# Patient Record
Sex: Female | Born: 1975 | Race: Black or African American | Hispanic: No | Marital: Married | Smoking: Never smoker
Health system: Southern US, Community
[De-identification: ages and names within clinical notes are randomized; demographics above are authoritative.]

## PROBLEM LIST (undated history)

## (undated) DIAGNOSIS — K219 Gastro-esophageal reflux disease without esophagitis: Secondary | ICD-10-CM

## (undated) HISTORY — PX: MYOMECTOMY: SHX85

## (undated) HISTORY — PX: CERVICAL CERCLAGE: SHX1329

## (undated) HISTORY — PX: DILATION AND CURETTAGE OF UTERUS: SHX78

---

## 2016-03-28 ENCOUNTER — Encounter: Payer: Self-pay | Admitting: Obstetrics and Gynecology

## 2016-04-03 ENCOUNTER — Encounter: Payer: Self-pay | Admitting: Obstetrics and Gynecology

## 2016-04-03 ENCOUNTER — Ambulatory Visit (INDEPENDENT_AMBULATORY_CARE_PROVIDER_SITE_OTHER): Payer: 59 | Admitting: Obstetrics and Gynecology

## 2016-04-03 VITALS — BP 101/66 | HR 83 | Ht 59.0 in | Wt 176.9 lb

## 2016-04-03 DIAGNOSIS — R102 Pelvic and perineal pain: Secondary | ICD-10-CM

## 2016-04-03 DIAGNOSIS — D259 Leiomyoma of uterus, unspecified: Secondary | ICD-10-CM

## 2016-04-03 DIAGNOSIS — Z98891 History of uterine scar from previous surgery: Secondary | ICD-10-CM | POA: Diagnosis not present

## 2016-04-03 DIAGNOSIS — O3431 Maternal care for cervical incompetence, first trimester: Secondary | ICD-10-CM

## 2016-04-03 DIAGNOSIS — E669 Obesity, unspecified: Secondary | ICD-10-CM

## 2016-04-03 DIAGNOSIS — O09529 Supervision of elderly multigravida, unspecified trimester: Secondary | ICD-10-CM

## 2016-04-03 DIAGNOSIS — Z9889 Other specified postprocedural states: Secondary | ICD-10-CM

## 2016-04-03 NOTE — Progress Notes (Signed)
NEW PATIENT GYN ENCOUNTER NOTE  Subjective:       Lindsey Mills is a 41 y.o. G71P1021 female here for gynecologic evaluation of the following issues:  1. Fertility 2. Pelvic pain  Mylove recently moved here from [place] and would like to establish care, as she's been trying to conceive since Oct 2017. She has regular cycles every 32-34 days with 6 days of bleeding and some mild cramping. She denies spotting between periods. She's had one spontaneous abortion which required dilation and curettage of the uterus in 2013. She has a history of leiomyomas which were removed in 2014 before conceiving in 2015. She delivered her term pregnancy in 2015 via cesarean section. The patient did have a cerclage placed during her successful pregnancy due to suspected cervical incompetence  Future pregnancies likely will require repeat cesarean section due to history of myomectomy and McDonald cerclage due to history of cervical incompetence. The patient has longer intervals between cycles and is interested in possible workup for infertility due to age.  Her LMP started today 2/7 and was reportedly 1 week late and came with more cramping and heavier bleeding than usual. She reports right-sided pelvic pain which was 3/4 tender on physical exam.   Gynecologic History Patient's last menstrual period was 04/03/2016 (exact date). Contraception: none Last Pap: "last year" Results were: Normal   Obstetric History OB History  Gravida Para Term Preterm AB Living  3 1 1   2 1   SAB TAB Ectopic Multiple Live Births  1       1    # Outcome Date GA Lbr Len/2nd Weight Sex Delivery Anes PTL Lv  3 Term 2015   6 lb 1.8 oz (2.771 kg) F CS-LTranv   LIV  2 SAB 2013          1 AB 2000              No past medical history on file.  Past Surgical History:  Procedure Laterality Date  . CESAREAN SECTION    . DILATION AND CURETTAGE OF UTERUS      No current outpatient prescriptions on file prior to visit.   No  current facility-administered medications on file prior to visit.     No Known Allergies  Social History   Social History  . Marital status: Married    Spouse name: N/A  . Number of children: N/A  . Years of education: N/A   Occupational History  . Not on file.   Social History Main Topics  . Smoking status: Never Smoker  . Smokeless tobacco: Never Used  . Alcohol use No  . Drug use: No  . Sexual activity: Yes    Birth control/ protection: None   Other Topics Concern  . Not on file   Social History Narrative  . No narrative on file    Family History  Problem Relation Age of Onset  . Colon cancer Father   . Breast cancer Neg Hx   . Ovarian cancer Neg Hx   . Diabetes Neg Hx   . Heart disease Neg Hx     The following portions of the patient's history were reviewed and updated as appropriate: allergies, current medications, past family history, past medical history, past social history, past surgical history and problem list.  Review of Systems Review of Systems - General ROS: negative for - chills, fatigue, fever, hot flashes, malaise or night sweats Hematological and Lymphatic ROS: negative for - bleeding problems or swollen lymph  nodes Gastrointestinal ROS: negative for - abdominal pain, blood in stools, change in bowel habits and nausea/vomiting Musculoskeletal ROS: negative for - joint pain, muscle pain or muscular weakness Genito-Urinary ROS: negative for - right -sided pelvic pain with onset of mensturation  Objective:   BP 101/66   Pulse 83   Ht 4\' 11"  (1.499 m)   Wt 176 lb 14.4 oz (80.2 kg)   LMP 04/03/2016 (Exact Date)   BMI 35.73 kg/m  CONSTITUTIONAL: Well-developed, well-nourished female in no acute distress.  HENT:  Normocephalic, atraumatic.  NECK: Normal range of motion, supple, no masses.  Normal thyroid.  SKIN: Skin is warm and dry. No rash noted. Not diaphoretic. No erythema. No pallor. [place]: Alert and oriented to person, place, and  time. PSYCHIATRIC: Normal mood and affect. Normal behavior. Normal judgment and thought content. CARDIOVASCULAR: Regular rate and rhythm RESPIRATORY: Lungs clear to auscultation bilaterally  BREASTS: Not Examined ABDOMEN: Soft, non distended; Non tender.  No Organomegaly. PELVIC:  External Genitalia: Normal  BUS: Normal  Vagina: Normal  Cervix: 2/4 Cervical motion tenderness  Uterus: 12 week size, midline, mobile, 2/4 tender    Adnexa: Normal  RV: Normal   Bladder: Nontender MUSCULOSKELETAL: Normal range of motion. No tenderness.  No cyanosis, clubbing, or edema.     Assessment:   1. Pelvic pain, Unclear etiology; possibly due to uterine fibroids, adhesions, or adenomyosis 2. Long intervals; Fertility assessment requested, starting with BBT tracking sheet  3. History of myomectomy 4. History of cervical incompetence 5. Only successful pregnancy included [employer] cerclage placement and primary cesarean section delivery; patient did have history of significant nausea with vomiting requiring IV hydration in first trimester  Plan:   1. Ultrasound is scheduled to assess pelvic pain and possible uterine fibroids 2. Return in 2 weeks for annual examination and further management planning regarding fertility workup 3. Return on March 6 for serum progesterone blood test. 4. Continue taking prenatal vitamins  5. Menstrual calendar monitoring; timed coitus; serum progesterone test to assess ovulation will be scheduled approximately 8 days after suspected ovulation (34 day cycle; suspected ovulation-day 20; progesterone to be obtained on cycle day 28)  A total of 45 minutes were spent face-to-face with the patient during the encounter with greater than 50% dealing with counseling and coordination of care.  Janeece Riggers, PA-S Brayton Mars, MD   I have seen, interviewed, and examined the patient in conjunction with the [facility].A. student and affirm the diagnosis and  management plan. Tyresa Prindiville A. Marrisa Kimber, MD, FACOG   Note: This dictation was prepared with Dragon dictation along with smaller phrase technology. Any transcriptional errors that result from this process are unintentional.

## 2016-04-03 NOTE — Patient Instructions (Signed)
1. Ultrasound is scheduled to assess pelvic pain and possible uterine fibroids 2. Return in 2 weeks for annual examination and further management planning regarding fertility workup 3. Return on March 6 for serum progesterone blood test. 4. Continue taking prenatal vitamins

## 2016-04-04 DIAGNOSIS — Z98891 History of uterine scar from previous surgery: Secondary | ICD-10-CM | POA: Insufficient documentation

## 2016-04-04 DIAGNOSIS — O09299 Supervision of pregnancy with other poor reproductive or obstetric history, unspecified trimester: Secondary | ICD-10-CM | POA: Insufficient documentation

## 2016-04-04 DIAGNOSIS — R102 Pelvic and perineal pain unspecified side: Secondary | ICD-10-CM | POA: Insufficient documentation

## 2016-04-04 DIAGNOSIS — E669 Obesity, unspecified: Secondary | ICD-10-CM | POA: Insufficient documentation

## 2016-04-04 DIAGNOSIS — Z9889 Other specified postprocedural states: Secondary | ICD-10-CM | POA: Insufficient documentation

## 2016-04-04 DIAGNOSIS — O09529 Supervision of elderly multigravida, unspecified trimester: Secondary | ICD-10-CM | POA: Insufficient documentation

## 2016-04-04 DIAGNOSIS — O3431 Maternal care for cervical incompetence, first trimester: Secondary | ICD-10-CM

## 2016-04-04 DIAGNOSIS — D259 Leiomyoma of uterus, unspecified: Secondary | ICD-10-CM | POA: Insufficient documentation

## 2016-04-05 ENCOUNTER — Ambulatory Visit (INDEPENDENT_AMBULATORY_CARE_PROVIDER_SITE_OTHER): Payer: 59

## 2016-04-05 DIAGNOSIS — R102 Pelvic and perineal pain: Secondary | ICD-10-CM

## 2016-04-22 NOTE — Progress Notes (Signed)
ANNUAL PREVENTATIVE CARE GYN  ENCOUNTER NOTE  Subjective:       Lindsey Mills is a 41 y.o. G82P1021 female here for a routine annual gynecologic exam.  Current complaints: 1.   Right sided pelvic pain  2.- u/s results   Gynecologic History Patient's last menstrual period was 04/03/2016 (exact date). Contraception: none Last Pap: 2017. Results were: normal Last mammogram: never  Menarche-age 35 Intervals-32-34 days Duration of flow-7 days Dysmenorrhea-7/10; no medication needed Dyspareunia-negative   Obstetric History History of [employer] cerclage and successful pregnancy History of myomectomy requiring cesarean section History of cesarean section OB History  Gravida Para Term Preterm AB Living  3 1 1   2 1   SAB TAB Ectopic Multiple Live Births  1       1    # Outcome Date GA Lbr Len/2nd Weight Sex Delivery Anes PTL Lv  3 Term 2015   6 lb 1.8 oz (2.771 kg) F CS-LTranv   LIV  2 SAB 2013          1 AB 2000              No past medical history on file.  Past Surgical History:  Procedure Laterality Date  . CESAREAN SECTION    . DILATION AND CURETTAGE OF UTERUS      No current outpatient prescriptions on file prior to visit.   No current facility-administered medications on file prior to visit.     No Known Allergies  Social History   Social History  . Marital status: Married    Spouse name: N/A  . Number of children: N/A  . Years of education: N/A   Occupational History  . Not on file.   Social History Main Topics  . Smoking status: Never Smoker  . Smokeless tobacco: Never Used  . Alcohol use No  . Drug use: No  . Sexual activity: Yes    Birth control/ protection: None   Other Topics Concern  . Not on file   Social History Narrative  . No narrative on file    Family History  Problem Relation Age of Onset  . Colon cancer Father   . Breast cancer Neg Hx   . Ovarian cancer Neg Hx   . Diabetes Neg Hx   . Heart disease Neg Hx     The  following portions of the patient's history were reviewed and updated as appropriate: allergies, current medications, past family history, past medical history, past social history, past surgical history and problem list.  Review of Systems Review of Systems  Constitutional: Negative for chills, diaphoresis, fever and weight loss.  HENT: Negative.   Eyes: Negative.   Respiratory: Negative.   Cardiovascular: Negative.   Gastrointestinal: Positive for constipation. Negative for abdominal pain, diarrhea, nausea and vomiting.       Prenatal vitamins cause constipation  Genitourinary: Negative for dysuria, frequency and urgency.       No dyspareunia  Musculoskeletal: Negative.   Skin: Negative.   Neurological: Negative.   Endo/Heme/Allergies: Negative.   Psychiatric/Behavioral: Negative.       Objective:   LMP 04/03/2016 (Exact Date)  BP 100/64   Pulse 88   Ht 4\' 11"  (1.499 m)   Wt 173 lb 14.4 oz (78.9 kg)   LMP 04/03/2016 (Exact Date)   BMI 35.12 kg/m   CONSTITUTIONAL: Well-developed, well-nourished female in no acute distress.  PSYCHIATRIC: Normal mood and affect. Normal behavior. Normal judgment and thought content. [name]: Alert and oriented to  person, place, and time. Normal muscle tone coordination. No cranial nerve deficit noted. HENT:  Normocephalic, atraumatic, External right and left ear normal.  EYES: Conjunctivae and EOM are normal. . No scleral icterus.  NECK: Normal range of motion, supple, no masses.  Normal thyroid.  SKIN: Skin is warm and dry. No rash noted. Not diaphoretic. No erythema. No pallor. CARDIOVASCULAR: Normal heart rate noted, regular rhythm, no murmur. RESPIRATORY: Clear to auscultation bilaterally. Effort and breath sounds normal, no problems with respiration noted. BREASTS: Symmetric in size. No masses, skin changes, nipple drainage, or lymphadenopathy. ABDOMEN: Soft, normal bowel sounds, no distention noted.  No tenderness, rebound or guarding.   BLADDER: Normal PELVIC:  External Genitalia: Normal  BUS: Normal  Vagina: Normal  Cervix: Normal; anteriorly located: No cervical motion tenderness; no lesions  Uterus: Normal; retroverted, top normal size, mobile, nontender;   Adnexa: Normal;nonpalpable and nontender  RV: External Exam NormaI, No Rectal Masses and Normal Sphincter tone  MUSCULOSKELETAL: Normal range of motion. No tenderness.  No cyanosis, clubbing, or edema.  2+ distal pulses. LYMPHATIC: No Axillary, Supraclavicular, or Inguinal Adenopathy.    Assessment:   Annual gynecologic examination 41 y.o. Contraception: none [id number] Problem List Items Addressed This Visit    History of cesarean section   History of myomectomy   Uterine leiomyoma   Obesity (BMI 35.0-39.9 without comorbidity)    Other Visit Diagnoses    Well woman exam with routine gynecological exam    -  Primary     Oligomenorrhea with cycles every 32-34 days Desiring conception History of myomectomy History of [employer] cerclage History of primary cesarean section delivery Desiring assistance with conception due to age and oligomenorrhea  Plan:  Pap: Pap Co Test Mammogram: Ordered Stool Guaiac Testing:  Not Indicated Labs: lipid vit d tsh a1c fbs Routine preventative health maintenance measures emphasized: Exercise/Diet/Weight control, Tobacco Warnings and Alcohol/Substance use risks Date 22 serum progesterone Continue prenatal vitamins Continue menstrual calendar monitoring Continue timed intercourse Return in 4 months for follow-up on oligomenorrhea and fertility Return to [place], CMA  Brayton Mars, MD  Note: This dictation was prepared with [employer] dictation along with smaller Company secretary. Any transcriptional errors that result from this process are unintentional.

## 2016-04-25 ENCOUNTER — Ambulatory Visit (INDEPENDENT_AMBULATORY_CARE_PROVIDER_SITE_OTHER): Payer: 59 | Admitting: Obstetrics and Gynecology

## 2016-04-25 ENCOUNTER — Encounter: Payer: Self-pay | Admitting: Obstetrics and Gynecology

## 2016-04-25 VITALS — BP 100/64 | HR 88 | Ht 59.0 in | Wt 173.9 lb

## 2016-04-25 DIAGNOSIS — Z9889 Other specified postprocedural states: Secondary | ICD-10-CM | POA: Diagnosis not present

## 2016-04-25 DIAGNOSIS — Z1231 Encounter for screening mammogram for malignant neoplasm of breast: Secondary | ICD-10-CM | POA: Diagnosis not present

## 2016-04-25 DIAGNOSIS — Z98891 History of uterine scar from previous surgery: Secondary | ICD-10-CM | POA: Diagnosis not present

## 2016-04-25 DIAGNOSIS — N946 Dysmenorrhea, unspecified: Secondary | ICD-10-CM | POA: Diagnosis not present

## 2016-04-25 DIAGNOSIS — Z01419 Encounter for gynecological examination (general) (routine) without abnormal findings: Secondary | ICD-10-CM | POA: Diagnosis not present

## 2016-04-25 DIAGNOSIS — E669 Obesity, unspecified: Secondary | ICD-10-CM

## 2016-04-25 DIAGNOSIS — N920 Excessive and frequent menstruation with regular cycle: Secondary | ICD-10-CM | POA: Insufficient documentation

## 2016-04-25 DIAGNOSIS — D259 Leiomyoma of uterus, unspecified: Secondary | ICD-10-CM | POA: Diagnosis not present

## 2016-04-25 DIAGNOSIS — Z1239 Encounter for other screening for malignant neoplasm of breast: Secondary | ICD-10-CM

## 2016-04-25 NOTE — Patient Instructions (Signed)
1. Pap smear is performed. 2. Mammogram is ordered 3. Continue with healthy eating and exercise 4. Continue with prenatal vitamins 5. Screening labs are ordered and will be obtained on 04/30/2016 along with day 22 serum progesterone 6. Return in 1 year for annual exam7. Return in 4 months for follow-up on oligomenorrhea and fertility; we will consider Clomid therapy if cycles are consistently long greater than 32 days   Health Maintenance, Female Adopting a healthy lifestyle and getting preventive care can go a long way to promote health and wellness. Talk with your health care provider about what schedule of regular examinations is right for you. This is a good chance for you to check in with your provider about disease prevention and staying healthy. In between checkups, there are plenty of things you can do on your own. Experts have done a lot of research about which lifestyle changes and preventive measures are most likely to keep you healthy. Ask your health care provider for more information. Weight and diet Eat a healthy diet  Be sure to include plenty of vegetables, fruits, low-fat dairy products, and lean protein.  Do not eat a lot of foods high in solid fats, added sugars, or salt.  Get regular exercise. This is one of the most important things you can do for your health.  Most adults should exercise for at least 150 minutes each week. The exercise should increase your heart rate and make you sweat (moderate-intensity exercise).  Most adults should also do strengthening exercises at least twice a week. This is in addition to the moderate-intensity exercise. Maintain a healthy weight  Body mass index (BMI) is a measurement that can be used to identify possible weight problems. It estimates body fat based on height and weight. Your health care provider can help determine your BMI and help you achieve or maintain a healthy weight.  For females 58 years of age and older:  A BMI  below 18.5 is considered underweight.  A BMI of 18.5 to 24.9 is normal.  A BMI of 25 to 29.9 is considered overweight.  A BMI of 30 and above is considered obese. Watch levels of cholesterol and blood lipids  You should start having your blood tested for lipids and cholesterol at 40 years of age, then have this test every 5 years.  You may need to have your cholesterol levels checked more often if:  Your lipid or cholesterol levels are high.  You are older than 41 years of age.  You are at high risk for heart disease. Cancer screening Lung Cancer  Lung cancer screening is recommended for adults 79-61 years old who are at high risk for lung cancer because of a history of smoking.  A yearly low-dose CT scan of the lungs is recommended for people who:  Currently smoke.  Have quit within the past 15 years.  Have at least a 30-pack-year history of smoking. A pack year is smoking an average of one pack of cigarettes a day for 1 year.  Yearly screening should continue until it has been 15 years since you quit.  Yearly screening should stop if you develop a health problem that would prevent you from having lung cancer treatment. Breast Cancer  Practice breast self-awareness. This means understanding how your breasts normally appear and feel.  It also means doing regular breast self-exams. Let your health care provider know about any changes, no matter how small.  If you are in your 20s or 30s, you should  have a clinical breast exam (CBE) by a health care provider every 1-3 years as part of a regular health exam.  If you are 31 or older, have a CBE every year. Also consider having a breast X-ray (mammogram) every year.  If you have a family history of breast cancer, talk to your health care provider about genetic screening.  If you are at high risk for breast cancer, talk to your health care provider about having an MRI and a mammogram every year.  Breast cancer gene (BRCA)  assessment is recommended for women who have family members with BRCA-related cancers. BRCA-related cancers include:  Breast.  Ovarian.  Tubal.  Peritoneal cancers.  Results of the assessment will determine the need for genetic counseling and BRCA1 and BRCA2 testing. Cervical Cancer  Your health care provider may recommend that you be screened regularly for cancer of the pelvic organs (ovaries, uterus, and vagina). This screening involves a pelvic examination, including checking for microscopic changes to the surface of your cervix (Pap test). You may be encouraged to have this screening done every 3 years, beginning at age 20.  For women ages 58-65, health care providers may recommend pelvic exams and Pap testing every 3 years, or they may recommend the Pap and pelvic exam, combined with testing for human papilloma virus (HPV), every 5 years. Some types of HPV increase your risk of cervical cancer. Testing for HPV may also be done on women of any age with unclear Pap test results.  Other health care providers may not recommend any screening for nonpregnant women who are considered low risk for pelvic cancer and who do not have symptoms. Ask your health care provider if a screening pelvic exam is right for you.  If you have had past treatment for cervical cancer or a condition that could lead to cancer, you need Pap tests and screening for cancer for at least 20 years after your treatment. If Pap tests have been discontinued, your risk factors (such as having a new sexual partner) need to be reassessed to determine if screening should resume. Some women have medical problems that increase the chance of getting cervical cancer. In these cases, your health care provider may recommend more frequent screening and Pap tests. Colorectal Cancer  This type of cancer can be detected and often prevented.  Routine colorectal cancer screening usually begins at 40 years of age and continues through 40  years of age.  Your health care provider may recommend screening at an earlier age if you have risk factors for colon cancer.  Your health care provider may also recommend using home test kits to check for hidden blood in the stool.  A small camera at the end of a tube can be used to examine your colon directly (sigmoidoscopy or colonoscopy). This is done to check for the earliest forms of colorectal cancer.  Routine screening usually begins at age 36.  Direct examination of the colon should be repeated every 5-10 years through 40 years of age. However, you may need to be screened more often if early forms of precancerous polyps or small growths are found. Skin Cancer  Check your skin from head to toe regularly.  Tell your health care provider about any new moles or changes in moles, especially if there is a change in a mole's shape or color.  Also tell your health care provider if you have a mole that is larger than the size of a pencil eraser.  Always use sunscreen.  Apply sunscreen liberally and repeatedly throughout the day.  Protect yourself by wearing long sleeves, pants, a wide-brimmed hat, and sunglasses whenever you are outside. Heart disease, diabetes, and high blood pressure  High blood pressure causes heart disease and increases the risk of stroke. High blood pressure is more likely to develop in:  People who have blood pressure in the high end of the normal range (130-139/85-89 mm Hg).  People who are overweight or obese.  People who are African American.  If you are 81-67 years of age, have your blood pressure checked every 3-5 years. If you are 27 years of age or older, have your blood pressure checked every year. You should have your blood pressure measured twice-once when you are at a hospital or clinic, and once when you are not at a hospital or clinic. Record the average of the two measurements. To check your blood pressure when you are not at a hospital or clinic,  you can use:  An automated blood pressure machine at a pharmacy.  A home blood pressure monitor.  If you are between 43 years and 68 years old, ask your health care provider if you should take aspirin to prevent strokes.  Have regular diabetes screenings. This involves taking a blood sample to check your fasting blood sugar level.  If you are at a normal weight and have a low risk for diabetes, have this test once every three years after 40 years of age.  If you are overweight and have a high risk for diabetes, consider being tested at a younger age or more often. Preventing infection Hepatitis B  If you have a higher risk for hepatitis B, you should be screened for this virus. You are considered at high risk for hepatitis B if:  You were born in a country where hepatitis B is common. Ask your health care provider which countries are considered high risk.  Your parents were born in a high-risk country, and you have not been immunized against hepatitis B (hepatitis B vaccine).  You have HIV or AIDS.  You use needles to inject street drugs.  You live with someone who has hepatitis B.  You have had sex with someone who has hepatitis B.  You get hemodialysis treatment.  You take certain medicines for conditions, including cancer, organ transplantation, and autoimmune conditions. Hepatitis C  Blood testing is recommended for:  Everyone born from [birth year] through 1964.  Anyone with known risk factors for hepatitis C. Sexually transmitted infections (STIs)  You should be screened for sexually transmitted infections (STIs) including gonorrhea and chlamydia if:  You are sexually active and are younger than 40 years of age.  You are older than 40 years of age and your health care provider tells you that you are at risk for this type of infection.  Your sexual activity has changed since you were last screened and you are at an increased risk for chlamydia or gonorrhea. Ask your  health care provider if you are at risk.  If you do not have HIV, but are at risk, it may be recommended that you take a prescription medicine daily to prevent HIV infection. This is called pre-exposure prophylaxis (PrEP). You are considered at risk if:  You are sexually active and do not regularly use condoms or know the HIV status of your partner(s).  You take drugs by injection.  You are sexually active with a partner who has HIV. Talk with your health care provider about whether you  are at high risk of being infected with HIV. If you choose to begin PrEP, you should first be tested for HIV. You should then be tested every 3 months for as long as you are taking PrEP. Pregnancy  If you are premenopausal and you may become pregnant, ask your health care provider about preconception counseling.  If you may become pregnant, take 400 to 800 micrograms (mcg) of folic acid every day.  If you want to prevent pregnancy, talk to your health care provider about birth control (contraception). Osteoporosis and menopause  Osteoporosis is a disease in which the bones lose minerals and strength with aging. This can result in serious bone fractures. Your risk for osteoporosis can be identified using a bone density scan.  If you are 65 years of age or older, or if you are at risk for osteoporosis and fractures, ask your health care provider if you should be screened.  Ask your health care provider whether you should take a calcium or vitamin D supplement to lower your risk for osteoporosis.  Menopause may have certain physical symptoms and risks.  Hormone replacement therapy may reduce some of these symptoms and risks. Talk to your health care provider about whether hormone replacement therapy is right for you. Follow these instructions at home:  Schedule regular health, dental, and eye exams.  Stay current with your immunizations.  Do not use any tobacco products including cigarettes, chewing  tobacco, or electronic cigarettes.  If you are pregnant, do not drink alcohol.  If you are breastfeeding, limit how much and how often you drink alcohol.  Limit alcohol intake to no more than 1 drink per day for nonpregnant women. One drink equals 12 ounces of beer, 5 ounces of wine, or 1 ounces of hard liquor.  Do not use street drugs.  Do not share needles.  Ask your health care provider for help if you need support or information about quitting drugs.  Tell your health care provider if you often feel depressed.  Tell your health care provider if you have ever been abused or do not feel safe at home. This information is not intended to replace advice given to you by your health care provider. Make sure you discuss any questions you have with your health care provider. Document Released: 08/27/2010 Document Revised: 07/20/2015 Document Reviewed: 11/15/2014 Elsevier Interactive Patient Education  2017 [employer].

## 2016-04-29 LAB — PAP IG AND HPV HIGH-RISK
HPV, high-risk: NEGATIVE
PAP SMEAR COMMENT: 0

## 2016-04-30 ENCOUNTER — Other Ambulatory Visit: Payer: 59

## 2016-04-30 NOTE — Addendum Note (Signed)
Addended by: Elouise Munroe on: 04/30/2016 10:06 AM   Modules accepted: Orders

## 2016-05-01 LAB — HEMOGLOBIN A1C
Est. average glucose Bld gHb Est-mCnc: 117 mg/dL
Hgb A1c MFr Bld: 5.7 % — ABNORMAL HIGH (ref 4.8–5.6)

## 2016-05-01 LAB — GLUCOSE, RANDOM: Glucose: 81 mg/dL (ref 65–99)

## 2016-05-01 LAB — LIPID PANEL
CHOL/HDL RATIO: 3.5 ratio (ref 0.0–4.4)
Cholesterol, Total: 164 mg/dL (ref 100–199)
HDL: 47 mg/dL (ref >39)
LDL CALC: 104 mg/dL — AB (ref 0–99)
TRIGLYCERIDES: 65 mg/dL (ref 0–149)
VLDL CHOLESTEROL CAL: 13 mg/dL (ref 5–40)

## 2016-05-01 LAB — PROGESTERONE: PROGESTERONE: 9.8 ng/mL

## 2016-05-01 LAB — TSH: TSH: 0.933 u[IU]/mL (ref 0.450–4.500)

## 2016-05-01 LAB — VITAMIN D 25 HYDROXY (VIT D DEFICIENCY, FRACTURES): Vit D, 25-Hydroxy: 21.5 ng/mL — ABNORMAL LOW (ref 30.0–100.0)

## 2016-08-27 ENCOUNTER — Encounter: Payer: 59 | Admitting: Obstetrics and Gynecology

## 2016-11-15 ENCOUNTER — Encounter: Payer: Self-pay | Admitting: Emergency Medicine

## 2016-11-15 ENCOUNTER — Ambulatory Visit
Admission: EM | Admit: 2016-11-15 | Discharge: 2016-11-15 | Disposition: A | Discharge status: Home / Self Care | Payer: 59 | Attending: Family Medicine | Admitting: Family Medicine

## 2016-11-15 DIAGNOSIS — J029 Acute pharyngitis, unspecified: Secondary | ICD-10-CM | POA: Diagnosis not present

## 2016-11-15 LAB — RAPID STREP SCREEN (MED CTR MEBANE ONLY): Streptococcus, Group A Screen (Direct): NEGATIVE

## 2016-11-15 MED ORDER — LIDOCAINE VISCOUS 2 % MT SOLN: OROMUCOSAL | 0 refills | Status: DC

## 2016-11-15 NOTE — ED Provider Notes (Signed)
[facility]    CSN: [id number] Arrival date & time: 11/15/16  1312     History   Chief Complaint Chief Complaint  Patient presents with  . Sore Throat    HPI Lindsey Mills is a 41 y.o. female.   The history is provided by the patient.  Sore Throat  This is a new problem. The current episode started 6 to 12 hours ago. The problem occurs constantly. The problem has not changed since onset.Pertinent negatives include no chest pain, no abdominal pain, no headaches and no shortness of breath.    History reviewed. No pertinent past medical history.  Patient Active Problem List   Diagnosis Date Noted  . Dysmenorrhea 04/25/2016  . Menorrhagia with regular cycle 04/25/2016  . Antepartum multigravida of advanced maternal age 78/09/2016  . Cervical incompetence during pregnancy in first trimester 04/04/2016  . History of cesarean section 04/04/2016  . History of myomectomy 04/04/2016  . Uterine leiomyoma 04/04/2016  . Pelvic pain 04/04/2016  . Obesity (BMI 35.0-39.9 without comorbidity) 04/04/2016    Past Surgical History:  Procedure Laterality Date  . CESAREAN SECTION    . DILATION AND CURETTAGE OF UTERUS      OB History    Gravida Para Term Preterm AB Living   3 1 1   2 1    SAB TAB Ectopic Multiple Live Births   1       1       Home Medications    Prior to Admission medications   Medication Sig Start Date End Date Taking? Authorizing Provider  lidocaine (XYLOCAINE) 2 % solution 20 ml gargle and spit q 6 hours prn 11/15/16   Norval Gable, MD    Family History Family History  Problem Relation Age of Onset  . Colon cancer Father   . Breast cancer Neg Hx   . Ovarian cancer Neg Hx   . Diabetes Neg Hx   . Heart disease Neg Hx     Social History Social History  Substance Use Topics  . Smoking status: Never Smoker  . Smokeless tobacco: Never Used  . Alcohol use No     Allergies   Patient has no known allergies.   Review of  Systems Review of Systems  Respiratory: Negative for shortness of breath.   Cardiovascular: Negative for chest pain.  Gastrointestinal: Negative for abdominal pain.  Neurological: Negative for headaches.     Physical Exam Triage Vital Signs ED Triage Vitals  Enc Vitals Group     BP 11/15/16 1418 116/81     Pulse Rate 11/15/16 1418 71     Resp 11/15/16 1418 16     Temp 11/15/16 1418 98.3 F (36.8 C)     Temp Source 11/15/16 1418 Oral     SpO2 11/15/16 1418 100 %     Weight 11/15/16 1420 165 lb (74.8 kg)     Height 11/15/16 1420 4\' 11"  (1.499 m)     Head Circumference --      Peak Flow --      Pain Score 11/15/16 1420 8     Pain Loc --      Pain Edu? --      Excl. in [place]? --    No data found.   Updated Vital Signs BP 116/81 (BP Location: Left Arm)   Pulse 71   Temp 98.3 F (36.8 C) (Oral)   Resp 16   Ht 4\' 11"  (1.499 m)   Wt 165 lb (74.8 kg)  LMP 10/07/2016 (Approximate)   SpO2 100%   BMI 33.33 kg/m   Visual Acuity Right Eye Distance:   Left Eye Distance:   Bilateral Distance:    Right Eye Near:   Left Eye Near:    Bilateral Near:     Physical Exam  Constitutional: She appears well-developed and well-nourished. No distress.  HENT:  Head: Normocephalic and atraumatic.  Right Ear: Tympanic membrane, external ear and ear canal normal.  Left Ear: Tympanic membrane, external ear and ear canal normal.  Nose: No mucosal edema, rhinorrhea, nose lacerations, sinus tenderness, nasal deformity, septal deviation or nasal septal hematoma. No epistaxis.  No foreign bodies. Right sinus exhibits no maxillary sinus tenderness and no frontal sinus tenderness. Left sinus exhibits no maxillary sinus tenderness and no frontal sinus tenderness.  Mouth/Throat: Uvula is midline and mucous membranes are normal. Posterior oropharyngeal erythema present. No oropharyngeal exudate, posterior oropharyngeal edema or tonsillar abscesses. No tonsillar exudate.  Eyes: Pupils are equal,  round, and reactive to light. Conjunctivae and EOM are normal. Right eye exhibits no discharge. Left eye exhibits no discharge. No scleral icterus.  Neck: Normal range of motion. Neck supple. No thyromegaly present.  Cardiovascular: Normal rate, regular rhythm and normal heart sounds.   Pulmonary/Chest: Effort normal and breath sounds normal. No respiratory distress. She has no wheezes. She has no rales.  Lymphadenopathy:    She has no cervical adenopathy.  Skin: She is not diaphoretic.  Nursing note and vitals reviewed.    UC Treatments / Results  Labs (all labs ordered are listed, but only abnormal results are displayed) Labs Reviewed  RAPID STREP SCREEN (NOT AT [facility])  CULTURE, GROUP A STREP [facility])    EKG  EKG Interpretation None       Radiology No results found.  Procedures Procedures (including critical care time)  Medications Ordered in UC Medications - No data to display   Initial Impression / Assessment and Plan / UC Course  I have reviewed the triage vital signs and the nursing notes.  Pertinent labs & imaging results that were available during my care of the patient were reviewed by me and considered in my medical decision making (see chart for details).      Final Clinical Impressions(s) / UC Diagnoses   Final diagnoses:  Viral pharyngitis    New Prescriptions Discharge Medication List as of 11/15/2016  3:19 PM    START taking these medications   Details  lidocaine (XYLOCAINE) 2 % solution 20 ml gargle and spit q 6 hours prn, Normal       1. Lab result (negative strep) and diagnosis reviewed with patient 2. rx as per orders above; reviewed possible side effects, interactions, risks and benefits  3. Recommend supportive treatment with otc analgesics, salt water gargles 4. Follow-up prn if symptoms worsen or don't improve  Controlled Substance Prescriptions [place] Controlled Substance Registry consulted? Not Applicable   Norval Gable,  MD 11/15/16 1536

## 2016-11-15 NOTE — ED Triage Notes (Signed)
Patient c/o sore throat and swelling x this am. Patient denies fever. Patient has not tried any OTC medications

## 2016-11-18 LAB — CULTURE, GROUP A STREP (THRC)

## 2017-04-29 ENCOUNTER — Encounter: Payer: 59 | Admitting: Obstetrics and Gynecology

## 2017-05-05 NOTE — Progress Notes (Signed)
ANNUAL PREVENTATIVE CARE GYN  ENCOUNTER NOTE  Subjective:       Lindsey Mills is a 42 y.o. G55P1021 female here for a routine annual gynecologic exam.  Current complaints: 1.  None   Patient states that menstrual cycles have been very irregular over the past 6 months.  She has been attempting to conceive without success.  Cycle intervals have ranged anywhere from 29 days to 34 days to 39 days to 43 days.  She is experiencing significant dysmenorrhea for the first couple days associated with heavy bleeding for 3-4 days followed by 2 days of lighter bleeding.  She also is experiencing occasional right-sided pain that is worse right around her cycles.  Cramps typically are right lower quadrant as well as low back with some radiation into the buttocks and right thigh.  She has no known prior history of endometriosis.  Gynecologic History No LMP recorded. Contraception: none Last Pap: 04/25/2016 neg/neg. Results were: normal Last mammogram: never  Menarche-age 55 Intervals-32-34 days Duration of flow-7 days Dysmenorrhea-7/10; no medication needed Dyspareunia-negative   Obstetric History History of [employer] cerclage and successful pregnancy History of myomectomy requiring cesarean section History of cesarean section OB History  Gravida Para Term Preterm AB Living  3 1 1   2 1   SAB TAB Ectopic Multiple Live Births  1       1    # Outcome Date GA Lbr Len/2nd Weight Sex Delivery Anes PTL Lv  3 Term 2015   6 lb 1.8 oz (2.771 kg) F CS-LTranv   LIV  2 SAB 2013          1 AB 2000              No past medical history on file.  Past Surgical History:  Procedure Laterality Date  . CESAREAN SECTION    . DILATION AND CURETTAGE OF UTERUS      Current Outpatient Medications on File Prior to Visit  Medication Sig Dispense Refill  . lidocaine (XYLOCAINE) 2 % solution 20 ml gargle and spit q 6 hours prn 100 mL 0   No current facility-administered medications on file prior to visit.      No Known Allergies  Social History   Socioeconomic History  . Marital status: Married    Spouse name: Not on file  . Number of children: Not on file  . Years of education: Not on file  . Highest education level: Not on file  Social Needs  . Financial resource strain: Not on file  . Food insecurity - worry: Not on file  . Food insecurity - inability: Not on file  . Transportation needs - medical: Not on file  . Transportation needs - non-medical: Not on file  Occupational History  . Not on file  Tobacco Use  . Smoking status: Never Smoker  . Smokeless tobacco: Never Used  Substance and Sexual Activity  . Alcohol use: No  . Drug use: No  . Sexual activity: Yes    Birth control/protection: None  Other Topics Concern  . Not on file  Social History Narrative  . Not on file    Family History  Problem Relation Age of Onset  . Colon cancer Father   . Breast cancer Neg Hx   . Ovarian cancer Neg Hx   . Diabetes Neg Hx   . Heart disease Neg Hx     The following portions of the patient's history were reviewed and updated as appropriate: allergies, current medications,  past family history, past medical history, past social history, past surgical history and problem list.  Review of Systems Review of Systems  Constitutional: Negative for chills, diaphoresis, fever and weight loss.  HENT: Negative.   Eyes: Negative.   Respiratory: Negative.   Cardiovascular: Negative.   Gastrointestinal: Negative for abdominal pain, diarrhea, nausea and vomiting.       Prenatal vitamins cause constipation  Genitourinary: Negative for dysuria, frequency and urgency.       No dyspareunia Very irregular cycles ranging from 29-43-day intervals Right-sided perimenstrual pain Heavy periods  Musculoskeletal: Negative.   Skin: Negative.   Neurological: Negative.   Endo/Heme/Allergies: Negative.   Psychiatric/Behavioral: Negative.       Objective:   BP 107/70   Pulse 81   Ht 4\' 11"   (1.499 m)   Wt 177 lb 14.4 oz (80.7 kg)   LMP 03/29/2017   BMI 35.93 kg/m  CONSTITUTIONAL: Well-developed, well-nourished female in no acute distress.  PSYCHIATRIC: Normal mood and affect. Normal behavior. Normal judgment and thought content. [place]: Alert and oriented to person, place, and time. Normal muscle tone coordination. No cranial nerve deficit noted. HENT:  Normocephalic, atraumatic, External right and left ear normal.  EYES: Conjunctivae and EOM are normal. . No scleral icterus.  NECK: Normal range of motion, supple, no masses.  Normal thyroid.  SKIN: Skin is warm and dry. No rash noted. Not diaphoretic. No erythema. No pallor. CARDIOVASCULAR: Normal heart rate noted, regular rhythm, no murmur. RESPIRATORY: Clear to auscultation bilaterally. Effort and breath sounds normal, no problems with respiration noted. BREASTS: Symmetric in size. No masses, skin changes, nipple drainage, or lymphadenopathy. ABDOMEN: Soft, normal bowel sounds, no distention noted.  No tenderness, rebound or guarding.  BLADDER: Normal PELVIC:  External Genitalia: Normal  BUS: Normal  Vagina: Normal  Cervix: Normal; anteriorly located: No cervical motion tenderness; no lesions  Uterus: Normal; midplane, top normal size, mobile, nontender;   Adnexa: Normal;nonpalpable and nontender  RV: External Exam NormaI, No Rectal Masses and Normal Sphincter tone  MUSCULOSKELETAL: Normal range of motion. No tenderness.  No cyanosis, clubbing, or edema.  2+ distal pulses. LYMPHATIC: No Axillary, Supraclavicular, or Inguinal Adenopathy.    Assessment:   Annual gynecologic examination 42 y.o. Contraception: none [id number] Problem List Items Addressed This Visit    History of cesarean section   History of myomectomy   Uterine leiomyoma   Obesity (BMI 35.0-39.9 without comorbidity)    Other Visit Diagnoses    Well woman exam with routine gynecological exam    -  Primary   Screening for breast cancer          Oligomenorrhea with cycles every 29-43 days Desiring conception History of myomectomy History of [employer] cerclage History of primary cesarean section delivery Desiring assistance with conception due to age and oligomenorrhea  Plan:  Pap: Due 2021 Mammogram: Ordered Stool Guaiac Testing:  Not Indicated Labs: lipid vit d tsh a1c fbs Routine preventative health maintenance measures emphasized: Exercise/Diet/Weight control, Tobacco Warnings and Alcohol/Substance use risks  Trial of Clomid 50 mg a day days 5 through 9, followed by timed colitis days 13 through 16, followed by day 22 serum progesterone Follow-up in 6 weeks Multivitamin daily Return to [place], CMA  Brayton Mars, MD  Note: This dictation was prepared with Dragon dictation along with smaller phrase technology. Any transcriptional errors that result from this process are unintentional.

## 2017-05-06 ENCOUNTER — Ambulatory Visit (INDEPENDENT_AMBULATORY_CARE_PROVIDER_SITE_OTHER): Payer: 59

## 2017-05-06 ENCOUNTER — Encounter: Payer: Self-pay | Admitting: Obstetrics and Gynecology

## 2017-05-06 ENCOUNTER — Ambulatory Visit (INDEPENDENT_AMBULATORY_CARE_PROVIDER_SITE_OTHER): Payer: 59 | Admitting: Obstetrics and Gynecology

## 2017-05-06 VITALS — BP 107/70 | HR 81 | Ht 59.0 in | Wt 177.9 lb

## 2017-05-06 DIAGNOSIS — Z01419 Encounter for gynecological examination (general) (routine) without abnormal findings: Secondary | ICD-10-CM

## 2017-05-06 DIAGNOSIS — N926 Irregular menstruation, unspecified: Secondary | ICD-10-CM

## 2017-05-06 DIAGNOSIS — E669 Obesity, unspecified: Secondary | ICD-10-CM

## 2017-05-06 DIAGNOSIS — N915 Oligomenorrhea, unspecified: Secondary | ICD-10-CM

## 2017-05-06 DIAGNOSIS — Z9889 Other specified postprocedural states: Secondary | ICD-10-CM

## 2017-05-06 DIAGNOSIS — N946 Dysmenorrhea, unspecified: Secondary | ICD-10-CM

## 2017-05-06 DIAGNOSIS — Z98891 History of uterine scar from previous surgery: Secondary | ICD-10-CM | POA: Diagnosis not present

## 2017-05-06 DIAGNOSIS — Z1231 Encounter for screening mammogram for malignant neoplasm of breast: Secondary | ICD-10-CM | POA: Diagnosis not present

## 2017-05-06 DIAGNOSIS — Z1239 Encounter for other screening for malignant neoplasm of breast: Secondary | ICD-10-CM

## 2017-05-06 DIAGNOSIS — D259 Leiomyoma of uterus, unspecified: Secondary | ICD-10-CM

## 2017-05-06 MED ORDER — CLOMIPHENE CITRATE 50 MG PO TABS: 50.0000 mg | ORAL_TABLET | Freq: Every day | ORAL | 1 refills | Status: DC

## 2017-05-06 NOTE — Patient Instructions (Signed)
1.  No Pap smear done.  Next Pap smear is due 2021. 2.  Screening mammogram is ordered 3.  Screening labs are ordered 4.  Continue with healthy eating and exercise 5.  Continue with prenatal vitamins daily 6.  Ultrasound is scheduled to assess uterine fibroids 7.  Begin Clomid 50 mg a day on days 5 through 9 of each cycle for helping with ovulation induction 8.  Timed intercourse on days 13, 14,15, following Clomid therapy 9.  Serum progesterone level on day 22 is ordered 10.  Return in 1 year for annual exam 11.  Return in 6 weeks for follow-up  Health Maintenance, Female Adopting a healthy lifestyle and getting preventive care can go a long way to promote health and wellness. Talk with your health care provider about what schedule of regular examinations is right for you. This is a good chance for you to check in with your provider about disease prevention and staying healthy. In between checkups, there are plenty of things you can do on your own. Experts have done a lot of research about which lifestyle changes and preventive measures are most likely to keep you healthy. Ask your health care provider for more information. Weight and diet Eat a healthy diet  Be sure to include plenty of vegetables, fruits, low-fat dairy products, and lean protein.  Do not eat a lot of foods high in solid fats, added sugars, or salt.  Get regular exercise. This is one of the most important things you can do for your health. ? Most adults should exercise for at least 150 minutes each week. The exercise should increase your heart rate and make you sweat (moderate-intensity exercise). ? Most adults should also do strengthening exercises at least twice a week. This is in addition to the moderate-intensity exercise.  Maintain a healthy weight  Body mass index (BMI) is a measurement that can be used to identify possible weight problems. It estimates body fat based on height and weight. Your health care  provider can help determine your BMI and help you achieve or maintain a healthy weight.  For females 38 years of age and older: ? A BMI below 18.5 is considered underweight. ? A BMI of 18.5 to 24.9 is normal. ? A BMI of 25 to 29.9 is considered overweight. ? A BMI of 30 and above is considered obese.  Watch levels of cholesterol and blood lipids  You should start having your blood tested for lipids and cholesterol at 41 years of age, then have this test every 5 years.  You may need to have your cholesterol levels checked more often if: ? Your lipid or cholesterol levels are high. ? You are older than 41 years of age. ? You are at high risk for heart disease.  Cancer screening Lung Cancer  Lung cancer screening is recommended for adults 84-6 years old who are at high risk for lung cancer because of a history of smoking.  A yearly low-dose CT scan of the lungs is recommended for people who: ? Currently smoke. ? Have quit within the past 15 years. ? Have at least a 30-pack-year history of smoking. A pack year is smoking an average of one pack of cigarettes a day for 1 year.  Yearly screening should continue until it has been 15 years since you quit.  Yearly screening should stop if you develop a health problem that would prevent you from having lung cancer treatment.  Breast Cancer  Practice breast self-awareness. This  means understanding how your breasts normally appear and feel.  It also means doing regular breast self-exams. Let your health care provider know about any changes, no matter how small.  If you are in your 20s or 30s, you should have a clinical breast exam (CBE) by a health care provider every 1-3 years as part of a regular health exam.  If you are 57 or older, have a CBE every year. Also consider having a breast X-ray (mammogram) every year.  If you have a family history of breast cancer, talk to your health care provider about genetic screening.  If you are  at high risk for breast cancer, talk to your health care provider about having an MRI and a mammogram every year.  Breast cancer gene (BRCA) assessment is recommended for women who have family members with BRCA-related cancers. BRCA-related cancers include: ? Breast. ? Ovarian. ? Tubal. ? Peritoneal cancers.  Results of the assessment will determine the need for genetic counseling and BRCA1 and BRCA2 testing.  Cervical Cancer Your health care provider Placke recommend that you be screened regularly for cancer of the pelvic organs (ovaries, uterus, and vagina). This screening involves a pelvic examination, including checking for microscopic changes to the surface of your cervix (Pap test). You Schnoebelen be encouraged to have this screening done every 3 years, beginning at age 42.  For women ages 21-65, health care providers Haji recommend pelvic exams and Pap testing every 3 years, or they Udell recommend the Pap and pelvic exam, combined with testing for human papilloma virus (HPV), every 5 years. Some types of HPV increase your risk of cervical cancer. Testing for HPV [place] also be done on women of any age with unclear Pap test results.  Other health care providers Englander not recommend any screening for nonpregnant women who are considered low risk for pelvic cancer and who do not have symptoms. Ask your health care provider if a screening pelvic exam is right for you.  If you have had past treatment for cervical cancer or a condition that could lead to cancer, you need Pap tests and screening for cancer for at least 20 years after your treatment. If Pap tests have been discontinued, your risk factors (such as having a new sexual partner) need to be reassessed to determine if screening should resume. Some women have medical problems that increase the chance of getting cervical cancer. In these cases, your health care provider Oshields recommend more frequent screening and Pap tests.  Colorectal Cancer  This type of  cancer can be detected and often prevented.  Routine colorectal cancer screening usually begins at 41 years of age and continues through 41 years of age.  Your health care provider Rubalcava recommend screening at an earlier age if you have risk factors for colon cancer.  Your health care provider Lycan also recommend using home test kits to check for hidden blood in the stool.  A small camera at the end of a tube can be used to examine your colon directly (sigmoidoscopy or colonoscopy). This is done to check for the earliest forms of colorectal cancer.  Routine screening usually begins at age 62.  Direct examination of the colon should be repeated every 5-10 years through 41 years of age. However, you Karas need to be screened more often if early forms of precancerous polyps or small growths are found.  Skin Cancer  Check your skin from head to toe regularly.  Tell your health care provider about any new  moles or changes in moles, especially if there is a change in a mole's shape or color.  Also tell your health care provider if you have a mole that is larger than the size of a pencil eraser.  Always use sunscreen. Apply sunscreen liberally and repeatedly throughout the day.  Protect yourself by wearing long sleeves, pants, a wide-brimmed hat, and sunglasses whenever you are outside.  Heart disease, diabetes, and high blood pressure  High blood pressure causes heart disease and increases the risk of stroke. High blood pressure is more likely to develop in: ? People who have blood pressure in the high end of the normal range (130-139/85-89 mm Hg). ? People who are overweight or obese. ? People who are African American.  If you are 70-73 years of age, have your blood pressure checked every 3-5 years. If you are 83 years of age or older, have your blood pressure checked every year. You should have your blood pressure measured twice-once when you are at a hospital or clinic, and once when you are  not at a hospital or clinic. Record the average of the two measurements. To check your blood pressure when you are not at a hospital or clinic, you can use: ? An automated blood pressure machine at a pharmacy. ? A home blood pressure monitor.  If you are between 21 years and 47 years old, ask your health care provider if you should take aspirin to prevent strokes.  Have regular diabetes screenings. This involves taking a blood sample to check your fasting blood sugar level. ? If you are at a normal weight and have a low risk for diabetes, have this test once every three years after 41 years of age. ? If you are overweight and have a high risk for diabetes, consider being tested at a younger age or more often. Preventing infection Hepatitis B  If you have a higher risk for hepatitis B, you should be screened for this virus. You are considered at high risk for hepatitis B if: ? You were born in a country where hepatitis B is common. Ask your health care provider which countries are considered high risk. ? Your parents were born in a high-risk country, and you have not been immunized against hepatitis B (hepatitis B vaccine). ? You have HIV or AIDS. ? You use needles to inject street drugs. ? You live with someone who has hepatitis B. ? You have had sex with someone who has hepatitis B. ? You get hemodialysis treatment. ? You take certain medicines for conditions, including cancer, organ transplantation, and autoimmune conditions.  Hepatitis C  Blood testing is recommended for: ? Everyone born from [birth year] through 1964. ? Anyone with known risk factors for hepatitis C.  Sexually transmitted infections (STIs)  You should be screened for sexually transmitted infections (STIs) including gonorrhea and chlamydia if: ? You are sexually active and are younger than 41 years of age. ? You are older than 41 years of age and your health care provider tells you that you are at risk for this type of  infection. ? Your sexual activity has changed since you were last screened and you are at an increased risk for chlamydia or gonorrhea. Ask your health care provider if you are at risk.  If you do not have HIV, but are at risk, it Camberos be recommended that you take a prescription medicine daily to prevent HIV infection. This is called pre-exposure prophylaxis (PrEP). You are considered at risk  if: ? You are sexually active and do not regularly use condoms or know the HIV status of your partner(s). ? You take drugs by injection. ? You are sexually active with a partner who has HIV.  Talk with your health care provider about whether you are at high risk of being infected with HIV. If you choose to begin PrEP, you should first be tested for HIV. You should then be tested every 3 months for as long as you are taking PrEP. Pregnancy  If you are premenopausal and you may become pregnant, ask your health care provider about preconception counseling.  If you may become pregnant, take 400 to 800 micrograms (mcg) of folic acid every day.  If you want to prevent pregnancy, talk to your health care provider about birth control (contraception). Osteoporosis and menopause  Osteoporosis is a disease in which the bones lose minerals and strength with aging. This can result in serious bone fractures. Your risk for osteoporosis can be identified using a bone density scan.  If you are 78 years of age or older, or if you are at risk for osteoporosis and fractures, ask your health care provider if you should be screened.  Ask your health care provider whether you should take a calcium or vitamin D supplement to lower your risk for osteoporosis.  Menopause may have certain physical symptoms and risks.  Hormone replacement therapy may reduce some of these symptoms and risks. Talk to your health care provider about whether hormone replacement therapy is right for you. Follow these instructions at home:  Schedule  regular health, dental, and eye exams.  Stay current with your immunizations.  Do not use any tobacco products including cigarettes, chewing tobacco, or electronic cigarettes.  If you are pregnant, do not drink alcohol.  If you are breastfeeding, limit how much and how often you drink alcohol.  Limit alcohol intake to no more than 1 drink per day for nonpregnant women. One drink equals 12 ounces of beer, 5 ounces of wine, or 1 ounces of hard liquor.  Do not use street drugs.  Do not share needles.  Ask your health care provider for help if you need support or information about quitting drugs.  Tell your health care provider if you often feel depressed.  Tell your health care provider if you have ever been abused or do not feel safe at home. This information is not intended to replace advice given to you by your health care provider. Make sure you discuss any questions you have with your health care provider. Document Released: 08/27/2010 Document Revised: 07/20/2015 Document Reviewed: 11/15/2014 Elsevier Interactive Patient Education  [employer].

## 2017-05-28 ENCOUNTER — Telehealth: Payer: Self-pay | Admitting: Obstetrics and Gynecology

## 2017-05-28 ENCOUNTER — Other Ambulatory Visit: Payer: 59

## 2017-05-28 ENCOUNTER — Other Ambulatory Visit: Payer: Self-pay

## 2017-05-28 DIAGNOSIS — N926 Irregular menstruation, unspecified: Secondary | ICD-10-CM

## 2017-05-28 DIAGNOSIS — N915 Oligomenorrhea, unspecified: Secondary | ICD-10-CM

## 2017-05-28 NOTE — Telephone Encounter (Signed)
The patient called and stated that she would like to speak with a nurse in regards to her labs that were drawn today. No other information was disclosed. Please advise.

## 2017-05-29 NOTE — Telephone Encounter (Signed)
Pt states she was unable to get her labs drawn yesterday in the office. Printed labs for pt to have drawn at [facility] or thru her employer. Pt was unable to have them done. Pt needed the lab d/t 22 day progesterone was due. Advised pt to have them drawn next month. Gave pt address to several [facility] so she may get her labs drawn there.

## 2017-10-29 ENCOUNTER — Telehealth: Payer: Self-pay | Admitting: Obstetrics and Gynecology

## 2017-10-29 NOTE — Telephone Encounter (Signed)
[facility]

## 2017-10-29 NOTE — Telephone Encounter (Signed)
The patient called and stated that she would like to speak with her nurse in regards to getting a refill of her medication and also wanting to know if she is required to come into the office to get a refill or if it can just be sent to her pharmacy. Please advise.

## 2017-10-30 NOTE — Telephone Encounter (Signed)
Pt aware she needs an appt to get a refill of clomid. Pt to check her calendar and call back for an appt.

## 2018-01-20 ENCOUNTER — Encounter: Payer: Self-pay | Admitting: Obstetrics and Gynecology

## 2018-01-20 ENCOUNTER — Ambulatory Visit: Payer: 59 | Admitting: Obstetrics and Gynecology

## 2018-01-20 VITALS — BP 123/79 | HR 86 | Ht 59.0 in | Wt 176.0 lb

## 2018-01-20 DIAGNOSIS — Z98891 History of uterine scar from previous surgery: Secondary | ICD-10-CM

## 2018-01-20 DIAGNOSIS — Z9889 Other specified postprocedural states: Secondary | ICD-10-CM | POA: Diagnosis not present

## 2018-01-20 DIAGNOSIS — Z3201 Encounter for pregnancy test, result positive: Secondary | ICD-10-CM

## 2018-01-20 DIAGNOSIS — N915 Oligomenorrhea, unspecified: Secondary | ICD-10-CM | POA: Diagnosis not present

## 2018-01-20 DIAGNOSIS — O3431 Maternal care for cervical incompetence, first trimester: Secondary | ICD-10-CM

## 2018-01-20 DIAGNOSIS — Z3200 Encounter for pregnancy test, result unknown: Secondary | ICD-10-CM

## 2018-01-20 DIAGNOSIS — O09521 Supervision of elderly multigravida, first trimester: Secondary | ICD-10-CM

## 2018-01-20 LAB — POCT URINE PREGNANCY: PREG TEST UR: POSITIVE — AB

## 2018-01-20 NOTE — Progress Notes (Signed)
HPI:      [name] is a 42 y.o. [phone] who LMP was Patient's last menstrual period was 11/30/2017 (exact date).  Subjective:   She presents today for pregnancy confirmation.  She reports that she had a positive home pregnancy test.  Patient has a history of infertility and was on Clomid but did not conceive.  When she stopped the Clomid she then had a positive pregnancy test at home.  She has had 2 prior pregnancies her first pregnancy ended in first trimester miscarriage. She had a multiple myomectomy performed by an infertility doctor.  She subsequently became pregnant and at approximately 20 weeks was noted to have cervical incompetence and underwent a cerclage.  Patient states the doctor told her the membranes were visible at the time of surgery.  She also underwent a primary cesarean delivery because of her previous uterine scarring at myomectomy. Her first pregnancy was also complicated by multiple hospital admissions for vomiting and dehydration.   She is not experienced any issues with vomiting during this pregnancy so far.  She is taking prenatal vitamins.   Also of significant note patient is advanced maternal age.    Hx: The following portions of the patient's history were reviewed and updated as appropriate:             She  has no past medical history on file. She does not have any pertinent problems on file. She  has a past surgical history that includes Cesarean section and Dilation and curettage of uterus. Her family history includes Colon cancer in her father. She  reports that she has never smoked. She has never used smokeless tobacco. She reports that she does not drink alcohol or use drugs. She has a current medication list which includes the following prescription(s): clomiphene. She has No Known Allergies.       Review of Systems:  Review of Systems  Constitutional: Denied constitutional symptoms, night sweats, recent illness, fatigue, fever, insomnia  and weight loss.  Eyes: Denied eye symptoms, eye pain, photophobia, vision change and visual disturbance.  Ears/Nose/Throat/Neck: Denied ear, nose, throat or neck symptoms, hearing loss, nasal discharge, sinus congestion and sore throat.  Cardiovascular: Denied cardiovascular symptoms, arrhythmia, chest pain/pressure, edema, exercise intolerance, orthopnea and palpitations.  Respiratory: Denied pulmonary symptoms, asthma, pleuritic pain, productive sputum, cough, dyspnea and wheezing.  Gastrointestinal: Denied, gastro-esophageal reflux, melena, nausea and vomiting.  Genitourinary: Denied genitourinary symptoms including symptomatic vaginal discharge, pelvic relaxation issues, and urinary complaints.  Musculoskeletal: Denied musculoskeletal symptoms, stiffness, swelling, muscle weakness and myalgia.  Dermatologic: Denied dermatology symptoms, rash and scar.  Neurologic: Denied neurology symptoms, dizziness, headache, neck pain and syncope.  Psychiatric: Denied psychiatric symptoms, anxiety and depression.  Endocrine: Denied endocrine symptoms including hot flashes and night sweats.   Meds:   Current Outpatient Medications on File Prior to Visit  Medication Sig Dispense Refill  . clomiPHENE (CLOMID) 50 MG tablet Take 1 tablet (50 mg total) by mouth daily. Days 5-7 (Patient not taking: Reported on 01/20/2018) 5 tablet 1   No current facility-administered medications on file prior to visit.     Objective:     Vitals:   01/20/18 1609  BP: 123/79  Pulse: 86              Urinary pregnancy test positive  Assessment:    [id number] Patient Active Problem List   Diagnosis Date Noted  . Irregular periods/menstrual cycles 05/06/2017  . Oligomenorrhea 05/06/2017  . Dysmenorrhea 04/25/2016  . Menorrhagia  with regular cycle 04/25/2016  . Antepartum multigravida of advanced maternal age 12/02/2016  . Cervical incompetence during pregnancy in first trimester 04/04/2016  . History of cesarean  section 04/04/2016  . History of myomectomy 04/04/2016  . Uterine leiomyoma 04/04/2016  . Pelvic pain 04/04/2016  . Obesity (BMI 35.0-39.9 without comorbidity) 04/04/2016     1. Possible pregnancy   2. Oligomenorrhea, unspecified type   3. History of myomectomy   4. History of cesarean section   5. Cervical incompetence during pregnancy in first trimester   6. Multigravida of advanced maternal age in first trimester        Plan:            1.  Continue prenatal vitamins  2.  Ultrasound for viability and dating  3.  Advanced maternal age - discussed genetic testing and strongly advised cell free DNA  4.  Discussed cerclage in detail-patient is not sure she wants a repeat cerclage.  5.  Discussed previous myomectomy and subsequent cesarean delivery-patient is desiring a repeat cesarean delivery.  Will discuss with Dr. Marcelline Mates rationale for 37-week versus 39-week schedule delivery.  6.  Plan nurse visit in 2 to 3 weeks  7.  Plan new OB visit in 5 weeks and preop prior to cerclage at approximately 14 weeks. Orders Orders Placed This Encounter  Procedures  . POCT urine pregnancy    No orders of the defined types were placed in this encounter.     F/U  No follow-ups on file. I spent 32 minutes involved in the care of this patient of which greater than 50% was spent discussing previous pregnancy history including pregnancy loss and cerclage as well as myomectomy and cesarean delivery.  Rationale for repeat cerclage as well as cesarean delivery for this pregnancy discussed in detail.  Advanced maternal age and genetic testing discussed.  Patient had numerous questions and we had a good discussion regarding the high risk nature of this pregnancy and her multiple problems as noted above.  All questions answered.  Finis Bud, M.D. 01/20/2018 4:35 PM

## 2018-01-20 NOTE — Progress Notes (Signed)
Pt here today for pregnancy confirmation. LMP 11/30/17. UPT positive.

## 2018-01-21 ENCOUNTER — Ambulatory Visit (INDEPENDENT_AMBULATORY_CARE_PROVIDER_SITE_OTHER): Payer: 59

## 2018-01-21 DIAGNOSIS — D259 Leiomyoma of uterus, unspecified: Secondary | ICD-10-CM

## 2018-01-21 DIAGNOSIS — N946 Dysmenorrhea, unspecified: Secondary | ICD-10-CM | POA: Diagnosis not present

## 2018-01-21 DIAGNOSIS — N926 Irregular menstruation, unspecified: Secondary | ICD-10-CM

## 2018-02-03 ENCOUNTER — Telehealth: Payer: Self-pay

## 2018-02-03 NOTE — Telephone Encounter (Signed)
Early ob pt unsure of lmp.    Had scan on 11/27- need mad to sign off onit  Started bleeding this morning.  Its like a light period. No cramps.  Would like to do a dating scan ASAP.  Pls advise.

## 2018-02-03 NOTE — Telephone Encounter (Signed)
Please advise 

## 2018-02-03 NOTE — Telephone Encounter (Signed)
Mad has reviewed scan from 11/27.  Early pregnancy. Will need to keep scan for 12/12.   Pt aware if bleeding increasesl like a  heavy period or cramps to contact office asap. Pt voices understanding.

## 2018-02-04 ENCOUNTER — Ambulatory Visit (INDEPENDENT_AMBULATORY_CARE_PROVIDER_SITE_OTHER): Payer: 59

## 2018-02-04 ENCOUNTER — Other Ambulatory Visit: Payer: Self-pay | Admitting: Surgical

## 2018-02-04 ENCOUNTER — Other Ambulatory Visit: Payer: 59

## 2018-02-04 ENCOUNTER — Other Ambulatory Visit: Payer: Self-pay | Admitting: Obstetrics and Gynecology

## 2018-02-04 DIAGNOSIS — Z3689 Encounter for other specified antenatal screening: Secondary | ICD-10-CM | POA: Diagnosis not present

## 2018-02-04 DIAGNOSIS — Z3200 Encounter for pregnancy test, result unknown: Secondary | ICD-10-CM

## 2018-02-04 DIAGNOSIS — Z1239 Encounter for other screening for malignant neoplasm of breast: Secondary | ICD-10-CM

## 2018-02-04 DIAGNOSIS — Z3201 Encounter for pregnancy test, result positive: Secondary | ICD-10-CM

## 2018-02-04 NOTE — Progress Notes (Unsigned)
beta

## 2018-02-05 ENCOUNTER — Other Ambulatory Visit: Payer: 59

## 2018-02-05 LAB — HCG, SERUM, QUALITATIVE: hCG,Beta Subunit,Qual,Serum: POSITIVE m[IU]/mL — AB (ref <6)

## 2018-02-06 ENCOUNTER — Other Ambulatory Visit: Payer: 59

## 2018-02-06 DIAGNOSIS — Z3200 Encounter for pregnancy test, result unknown: Secondary | ICD-10-CM

## 2018-02-06 LAB — BETA HCG QUANT (REF LAB): hCG Quant: 3888 m[IU]/mL

## 2018-02-09 ENCOUNTER — Other Ambulatory Visit: Payer: Self-pay

## 2018-02-09 ENCOUNTER — Emergency Department
Admission: EM | Admit: 2018-02-09 | Discharge: 2018-02-10 | Disposition: A | Discharge status: Home / Self Care | Payer: 59 | Attending: Emergency Medicine | Admitting: Emergency Medicine

## 2018-02-09 ENCOUNTER — Encounter: Payer: Self-pay | Admitting: Emergency Medicine

## 2018-02-09 ENCOUNTER — Emergency Department: Payer: 59

## 2018-02-09 ENCOUNTER — Telehealth: Payer: Self-pay | Admitting: Obstetrics and Gynecology

## 2018-02-09 DIAGNOSIS — O200 Threatened abortion: Secondary | ICD-10-CM | POA: Insufficient documentation

## 2018-02-09 DIAGNOSIS — O4690 Antepartum hemorrhage, unspecified, unspecified trimester: Secondary | ICD-10-CM

## 2018-02-09 DIAGNOSIS — N939 Abnormal uterine and vaginal bleeding, unspecified: Secondary | ICD-10-CM | POA: Insufficient documentation

## 2018-02-09 NOTE — ED Notes (Signed)
Patient refusing lab draw. Md aware. Report to receiving nurse.

## 2018-02-09 NOTE — ED Notes (Addendum)
Per Dr Mable Paris pt only wants to have Ultrasound. Okay to go to Ultrasound per MD Rifenbark without hcG or Urine Preg.

## 2018-02-09 NOTE — Telephone Encounter (Signed)
Patient is requesting lab results.

## 2018-02-09 NOTE — ED Triage Notes (Signed)
Pt in via POV, reports taking positive pregnancy test 11/24.  Pt with worsening vaginal bleeding x one week, denies any pain/cramping.  Pt ambulatory to triage, vitals WDL, NAD noted at this time.

## 2018-02-09 NOTE — Telephone Encounter (Signed)
The patient requested a call back from [place] in regards to her lab work. No other information was disclosed. Please advise.

## 2018-02-09 NOTE — ED Provider Notes (Signed)
[facility] Emergency Department Provider Note  ____________________________________________   First MD Initiated Contact with Patient 02/09/18 2233     (approximate)  I have reviewed the triage vital signs and the nursing notes.   HISTORY  Chief Complaint Vaginal Bleeding   HPI Lindsey Mills is a 42 y.o. female who comes to the emergency department with bright red blood when wiping from her vagina that began today.  She is roughly [redacted] weeks pregnant with an unplanned but desired pregnancy.   She was not taking fertility medications.  She is O+ blood type.  She had an [facility] gynecology appointment several days ago and had an ultrasound at that time showing an intrauterine pregnancy but did not show any heartbeat just yet.  The patient declines all blood work today stating she only wants an ultrasound.  She has follow-up with her OB gynecologist later on tomorrow in about 10 hours.  She declines pelvic exam as well.  Her bleeding seem to come on suddenly is intermittent and nothing seems to make it better or worse.   History reviewed. No pertinent past medical history.  Patient Active Problem List   Diagnosis Date Noted  . Irregular periods/menstrual cycles 05/06/2017  . Oligomenorrhea 05/06/2017  . Dysmenorrhea 04/25/2016  . Menorrhagia with regular cycle 04/25/2016  . Antepartum multigravida of advanced maternal age 36/09/2016  . Cervical incompetence during pregnancy in first trimester 04/04/2016  . History of cesarean section 04/04/2016  . History of myomectomy 04/04/2016  . Uterine leiomyoma 04/04/2016  . Pelvic pain 04/04/2016  . Obesity (BMI 35.0-39.9 without comorbidity) 04/04/2016    Past Surgical History:  Procedure Laterality Date  . CESAREAN SECTION    . DILATION AND CURETTAGE OF UTERUS      Prior to Admission medications   Medication Sig Start Date End Date Taking? Authorizing Provider  Prenatal Vit-Fe Fumarate-FA  (MULTIVITAMIN-PRENATAL) 27-0.8 MG TABS tablet Take 1 tablet by mouth daily at 12 noon.    [provider]    Allergies Patient has no known allergies.  Family History  Problem Relation Age of Onset  . Colon cancer Father   . Breast cancer Neg Hx   . Ovarian cancer Neg Hx   . Diabetes Neg Hx   . Heart disease Neg Hx     Social History Social History   Tobacco Use  . Smoking status: Never Smoker  . Smokeless tobacco: Never Used  Substance Use Topics  . Alcohol use: No  . Drug use: No    Review of Systems Constitutional: No fever/chills Eyes: No visual changes. ENT: No sore throat. Cardiovascular: Denies chest pain. Respiratory: Denies shortness of breath. Gastrointestinal: No abdominal pain.  No nausea, no vomiting.  No diarrhea.  No constipation. Genitourinary: Positive for vaginal bleeding Musculoskeletal: Negative for back pain. Skin: Negative for rash. Neurological: Negative for headaches, focal weakness or numbness.   ____________________________________________   PHYSICAL EXAM:  VITAL SIGNS: ED Triage Vitals  Enc Vitals Group     BP 02/09/18 2049 (!) 129/94     Pulse Rate 02/09/18 2049 73     Resp 02/09/18 2049 16     Temp 02/09/18 2049 98.3 F (36.8 C)     Temp Source 02/09/18 2049 Oral     SpO2 02/09/18 2049 100 %     Weight 02/09/18 2050 170 lb (77.1 kg)     Height 02/09/18 2050 4\' 11"  (1.499 m)     Head Circumference --  Peak Flow --      Pain Score 02/09/18 2050 0     Pain Loc --      Pain Edu? --      Excl. in [place]? --     Constitutional: Alert and oriented x4 nontoxic no diaphoresis speaks in full clear sentences Eyes: PERRL EOMI. Head: Atraumatic. Nose: No congestion/rhinnorhea. Mouth/Throat: No trismus Neck: No stridor.   Cardiovascular: Normal rate, regular rhythm. Grossly normal heart sounds.  Good peripheral circulation. Respiratory: Normal respiratory effort.  No retractions. Lungs CTAB and moving good  air Gastrointestinal: Soft nontender Musculoskeletal: No lower extremity edema   Neurologic:  Normal speech and language. No gross focal neurologic deficits are appreciated. Skin:  Skin is warm, dry and intact. No rash noted. Psychiatric: Mood and affect are normal. Speech and behavior are normal.    ____________________________________________   DIFFERENTIAL includes but not limited to  Inevitable abortion, completed abortion, threatened abortion, ectopic pregnancy, heterotopic pregnancy ____________________________________________   LABS (all labs ordered are listed, but only abnormal results are displayed)  Labs Reviewed - No data to display   __________________________________________  EKG   ____________________________________________  RADIOLOGY  Pelvic ultrasound reviewed by me shows intrauterine pregnancy with cysts in the uterus ____________________________________________   PROCEDURES  Procedure(s) performed: no  Procedures  Critical Care performed: no  ____________________________________________   INITIAL IMPRESSION / ASSESSMENT AND PLAN / ED COURSE  Pertinent labs & imaging results that were available during my care of the patient were reviewed by me and considered in my medical decision making (see chart for details).   As part of my medical decision making, I reviewed the following data within the [place] History obtained from family if available, nursing notes, old chart and ekg, as well as notes from prior ED visits.  The patient is O+ so does not need RhoGam.  I recommended a repeat beta hCG however she declined stating she was "only here for an ultrasound".  Ultrasound obtained showing an intrauterine pregnancy along with some fibroids.  I discussed with the patient pelvic rest and have encouraged her to keep her follow-up with OB gynecologist in 9 hours as scheduled.  Strict return precautions have been given.       ____________________________________________   FINAL CLINICAL IMPRESSION(S) / ED DIAGNOSES  Final diagnoses:  Threatened abortion      NEW MEDICATIONS STARTED DURING THIS VISIT:  Discharge Medication List as of 02/10/2018  2:01 AM       Note:  This document was prepared using Dragon voice recognition software and may include unintentional dictation errors.    Darel Hong, MD 02/14/18 [phone]

## 2018-02-09 NOTE — ED Notes (Signed)
Pt declines blood draw to check hCG, pt states, "I just had it done Friday and they will check it again tomorrow if everything checks out here."

## 2018-02-09 NOTE — ED Notes (Signed)
Reports vag bleeding x 4 days. Refusing urine preg, reports had a bata done a few days ago. Labs drawn and sent.

## 2018-02-09 NOTE — ED Notes (Signed)
Patient transported to Ultrasound 

## 2018-02-10 ENCOUNTER — Ambulatory Visit: Payer: 59 | Admitting: Obstetrics and Gynecology

## 2018-02-10 ENCOUNTER — Encounter: Payer: Self-pay | Admitting: Obstetrics and Gynecology

## 2018-02-10 VITALS — BP 105/71 | HR 86 | Ht 59.0 in | Wt 179.5 lb

## 2018-02-10 DIAGNOSIS — O209 Hemorrhage in early pregnancy, unspecified: Secondary | ICD-10-CM | POA: Diagnosis not present

## 2018-02-10 NOTE — Progress Notes (Signed)
HPI:      [name] is a 42 y.o. [phone] who LMP was No LMP recorded (lmp unknown). Patient is pregnant.  Subjective:   She presents today after being seen in the emergency department for a small amount of vaginal bleeding in early pregnancy.  Her bleeding was only noted when she wiped it did not bleed enough to get on the panty liner.  Because of her history of uterine fibroids and cervical incompetence she presented to the emergency department.  An ultrasound performed to the emergency department revealed a 6-week intrauterine pregnancy with a heartbeat of 80.  A fibroid was noted as well as a small cystic structure possibly in the myometrium.  No subchorionic hemorrhage is noted.    Hx: The following portions of the patient's history were reviewed and updated as appropriate:             She  has no past medical history on file. She does not have any pertinent problems on file. She  has a past surgical history that includes Cesarean section and Dilation and curettage of uterus. Her family history includes Colon cancer in her father. She  reports that she has never smoked. She has never used smokeless tobacco. She reports that she does not drink alcohol or use drugs. She has a current medication list which includes the following prescription(s): multivitamin-prenatal. She has No Known Allergies.       Review of Systems:  Review of Systems  Constitutional: Denied constitutional symptoms, night sweats, recent illness, fatigue, fever, insomnia and weight loss.  Eyes: Denied eye symptoms, eye pain, photophobia, vision change and visual disturbance.  Ears/Nose/Throat/Neck: Denied ear, nose, throat or neck symptoms, hearing loss, nasal discharge, sinus congestion and sore throat.  Cardiovascular: Denied cardiovascular symptoms, arrhythmia, chest pain/pressure, edema, exercise intolerance, orthopnea and palpitations.  Respiratory: Denied pulmonary symptoms, asthma, pleuritic pain,  productive sputum, cough, dyspnea and wheezing.  Gastrointestinal: Denied, gastro-esophageal reflux, melena, nausea and vomiting.  Genitourinary: Denied genitourinary symptoms including symptomatic vaginal discharge, pelvic relaxation issues, and urinary complaints.  Musculoskeletal: Denied musculoskeletal symptoms, stiffness, swelling, muscle weakness and myalgia.  Dermatologic: Denied dermatology symptoms, rash and scar.  Neurologic: Denied neurology symptoms, dizziness, headache, neck pain and syncope.  Psychiatric: Denied psychiatric symptoms, anxiety and depression.  Endocrine: Denied endocrine symptoms including hot flashes and night sweats.   Meds:   Current Outpatient Medications on File Prior to Visit  Medication Sig Dispense Refill  . Prenatal Vit-Fe Fumarate-FA (MULTIVITAMIN-PRENATAL) 27-0.8 MG TABS tablet Take 1 tablet by mouth daily at 12 noon.     No current facility-administered medications on file prior to visit.     Objective:     Vitals:   02/10/18 1542  BP: 105/71  Pulse: 86              Ultrasound findings directly reviewed with the patient  Assessment:    [id number] Patient Active Problem List   Diagnosis Date Noted  . Irregular periods/menstrual cycles 05/06/2017  . Oligomenorrhea 05/06/2017  . Dysmenorrhea 04/25/2016  . Menorrhagia with regular cycle 04/25/2016  . Antepartum multigravida of advanced maternal age 44/09/2016  . Cervical incompetence during pregnancy in first trimester 04/04/2016  . History of cesarean section 04/04/2016  . History of myomectomy 04/04/2016  . Uterine leiomyoma 04/04/2016  . Pelvic pain 04/04/2016  . Obesity (BMI 35.0-39.9 without comorbidity) 04/04/2016     1. First trimester bleeding     Current ultrasound reveals a viable intrauterine pregnancy.  Heartbeat is  80.  Uterine fibroids noted.   Plan:            1.  Expectant management.  Pelvic rest.  Follow-up ultrasound in 2 weeks. Orders No orders of the  defined types were placed in this encounter.   No orders of the defined types were placed in this encounter.     F/U  Return in about 2 weeks (around 02/24/2018). I spent 20 minutes involved in the care of this patient of which greater than 50% was spent discussing I spent a significant amount of time trying to reassure the patient regarding her intrauterine pregnancy and discussing the risks of miscarriages versus the risk of continued pregnancy.  Relationship to cervical incompetence discussed.  Previous uterine fibroidectomy was discussed in detail work-up and follow-up for spotting early pregnancy discussed.  All questions answered.  Finis Bud, M.D. 02/10/2018 4:57 PM

## 2018-02-10 NOTE — Discharge Instructions (Signed)
Please follow up with your OB/Gyn later on today as scheduled and maintain strict pelvic rest until that appointment.  Return to the ED sooner for any concerns.  It was a pleasure to take care of you today, and thank you for coming to our emergency department.  If you have any questions or concerns before leaving please ask the nurse to grab me and I'm more than happy to go through your aftercare instructions again.  If you have any concerns once you are home that you are not improving or are in fact getting worse before you can make it to your follow-up appointment, please do not hesitate to call 911 and come back for further evaluation.  Lindsey Hong, MD  Results for orders placed or performed in visit on 02/06/18  Beta hCG quant (ref lab)  Result Value Ref Range   hCG Quant 3,888 mIU/mL   US Ob Comp Less 14 Wks  Result Date: 02/10/2018 CLINICAL DATA:  Pregnant patient in first-trimester pregnancy with vaginal bleeding. EXAM: OBSTETRIC <14 WK [place] AND TRANSVAGINAL OB US TECHNIQUE: Both transabdominal and transvaginal ultrasound examinations were performed for complete evaluation of the gestation as well as the maternal uterus, adnexal regions, and pelvic cul-de-sac. Transvaginal technique was performed to assess early pregnancy. COMPARISON:  Obstetric ultrasound 02/04/2018 at an outside institution FINDINGS: Intrauterine gestational sac: Single Yolk sac:  Visualized. Embryo:  Visualized. Cardiac Activity: Visualized. Heart Rate: 80 bpm CRL:  7 mm   6 w   4 d                  [place] EDC: 10/01/2018 Subchorionic hemorrhage:  None visualized. Maternal uterus/adnexae: Adjacent to the gestational sac is a subcentimeter cystic area, which is adjacent to a uterine fibroid. Heterogeneous myometrial echotexture. Small amount of fluid in the cervical canal. The right ovary measures 2.1 x 3.3 x 2.7 cm and contains a corpus luteal cyst. Shadowing 4 mm calcification is also seen in the right ovary. Left ovary  measures 2.6 x 1.7 x 2.3 cm and is normal. No pelvic free fluid. IMPRESSION: 1. Single live intrauterine pregnancy estimated gestational age [redacted] weeks 4 days based on crown-rump length for ultrasound [facility] 10/01/2018. 2. Low fetal heart rate of 80 beats per minute. Recommend short interval follow-up ultrasound to ensure appropriate fetal growth. 3. Uterine fibroid as well as heterogeneous myometrial echotexture. Cystic structure adjacent to a uterine fibroid and the gestational sac is of unknown significance. Recommend attention to this at follow-up. Electronically Signed   By: Lindsey Mills M.D.   On: 02/10/2018 01:49   US Ob Transvaginal  Result Date: 02/10/2018 CLINICAL DATA:  Pregnant patient in first-trimester pregnancy with vaginal bleeding. EXAM: OBSTETRIC <14 WK [place] AND TRANSVAGINAL OB US TECHNIQUE: Both transabdominal and transvaginal ultrasound examinations were performed for complete evaluation of the gestation as well as the maternal uterus, adnexal regions, and pelvic cul-de-sac. Transvaginal technique was performed to assess early pregnancy. COMPARISON:  Obstetric ultrasound 02/04/2018 at an outside institution FINDINGS: Intrauterine gestational sac: Single Yolk sac:  Visualized. Embryo:  Visualized. Cardiac Activity: Visualized. Heart Rate: 80 bpm CRL:  7 mm   6 w   4 d                  [place] EDC: 10/01/2018 Subchorionic hemorrhage:  None visualized. Maternal uterus/adnexae: Adjacent to the gestational sac is a subcentimeter cystic area, which is adjacent to a uterine fibroid. Heterogeneous myometrial echotexture. Small amount of fluid in the cervical canal.  The right ovary measures 2.1 x 3.3 x 2.7 cm and contains a corpus luteal cyst. Shadowing 4 mm calcification is also seen in the right ovary. Left ovary measures 2.6 x 1.7 x 2.3 cm and is normal. No pelvic free fluid. IMPRESSION: 1. Single live intrauterine pregnancy estimated gestational age [redacted] weeks 4 days based on crown-rump length for ultrasound  [facility] 10/01/2018. 2. Low fetal heart rate of 80 beats per minute. Recommend short interval follow-up ultrasound to ensure appropriate fetal growth. 3. Uterine fibroid as well as heterogeneous myometrial echotexture. Cystic structure adjacent to a uterine fibroid and the gestational sac is of unknown significance. Recommend attention to this at follow-up. Electronically Signed   By: Lindsey Mills M.D.   On: 02/10/2018 01:49   US Ob Transvaginal  Result Date: 02/05/2018 Patient Name: Lindsey Mills DOB: 12/11/1975 MRN: [id number] ULTRASOUND REPORT Location: [facility] Date of Service: 02/04/2018 Indications:viability Findings: Lindsey Mills intrauterine pregnancy is visualized with only a gestational sac (gsd = [redacted]w[redacted]d) is seen Yolk sac is visualized and appears abnormal Amnion: not visualized Retroverted uterus Right adnexa is normal in appearance. Left adnexa is normal appearance. (both ovaries not seen d/t filled bowel) Corpus luteal cyst:  is not visualized Survey of the adnexa demonstrates no adnexal masses. There is no free peritoneal fluid in the cul de sac. Impression: 1.  Singleton Intrauterine pregnancy by U/S.with only a gestational sac (gsd = [redacted]w[redacted]d) is seen. Undetermined viability. 2.Yolk sac is visualized and appears abnormal Recommendations: 1.Clinical correlation with the patient's History and Physical Exam. Lindsey Mills ,RDMS I have reviewed this study and agree with documented findings. Lindsey Maid, MD [facility]   US Pelvis (transabdominal Only)  Result Date: 02/03/2018 Patient Name: Lindsey Mills DOB: May 19, 1975 MRN: [id number] ULTRASOUND REPORT Location: [facility] Date of Service: 01/21/2018 Indications:dating Findings: Single gestational sac is visualized without the presence of yolk sac or embryo at this time Right Ovary is normal in appearance. Left Ovary is not visualized due to bowel presence obscuring the adnexa. Corpus luteal cyst:  is not  visualized Survey of the adnexa demonstrates no adnexal mass. There is no free peritoneal fluid in the cul de sac. Uterus is retroverted and has a single intramural fibroid in the posterior uterine wall  measuring 5.2x3.6cm Impression: 1.  Early pregnancy with visualization of gestational sac only, without evidence of yolk sac or embryo.  Viability uncertain. 2.  Retroverted fibroid uterus contains a single IM fibroid in the posterior uterine wall measuring 5.2x3.6cm Recommendations: 1.Clinical correlation with the patient's History and Physical Exam. 2.  Repeat ultrasound in 2 weeks to confirm viability. Lindsey Mills,RDMS Brayton Mars, MD

## 2018-02-11 NOTE — Telephone Encounter (Signed)
Please inform patient that we had an issue with the lab for one of her HCGs, and have sent the requisition for this to be corrected but still have not received it as of yet.  However I did see that she came in for an ultrasound yesterday which noted that there was a fetus with a heartbeat, however it was still recommended that she follow up again early next a week as the heart rate was a slightly lower than usual.    Dr. Marcelline Mates

## 2018-02-11 NOTE — Telephone Encounter (Signed)
Patient was seen by Dr. Amalia Hailey yesterday.

## 2018-02-12 ENCOUNTER — Telehealth: Payer: Self-pay

## 2018-02-12 ENCOUNTER — Telehealth: Payer: Self-pay | Admitting: Obstetrics and Gynecology

## 2018-02-12 NOTE — Telephone Encounter (Signed)
Pt was called to go over her beta test results. Pt was asked if she could could come in on Feb 16, 2018 at 2:45pm for u/s and a follow up visit with DJE at 3pm. Pt stated that she could.

## 2018-02-12 NOTE — Telephone Encounter (Signed)
Called [facility] to check on the status of an add on lab HCG qant. Was informed that it was one test of HCG qant on file for stat on 02/06/18. [facility] stated that they would fax the results to the office.

## 2018-02-12 NOTE — Telephone Encounter (Signed)
The patient called and stated that she needs to speak with a nurse. I advised the patient that I will send a message to her nurse and she will receive a call back as soon as the nurse is able to reach out. Please advise.

## 2018-02-13 ENCOUNTER — Telehealth: Payer: Self-pay

## 2018-02-13 LAB — BETA HCG QUANT (REF LAB): hCG Quant: 4874 m[IU]/mL

## 2018-02-13 LAB — SPECIMEN STATUS REPORT

## 2018-02-13 NOTE — Telephone Encounter (Signed)
Pt called to speak with her concerning her issues and to reschedule her appointment due to her being in 15 minute U/S schedule time. Pt stated that she could come in on Feb 16, 2018 at 9:15am for U/S and 2pm for a visit with DJE. Pt stated that if she was unable to do it she would call and reschedule.

## 2018-02-14 ENCOUNTER — Other Ambulatory Visit: Payer: Self-pay

## 2018-02-14 ENCOUNTER — Encounter: Payer: Self-pay | Admitting: Emergency Medicine

## 2018-02-14 ENCOUNTER — Emergency Department
Admission: EM | Admit: 2018-02-14 | Discharge: 2018-02-14 | Disposition: A | Discharge status: Home / Self Care | Payer: 59 | Attending: Emergency Medicine | Admitting: Emergency Medicine

## 2018-02-14 DIAGNOSIS — O200 Threatened abortion: Secondary | ICD-10-CM | POA: Insufficient documentation

## 2018-02-14 DIAGNOSIS — N938 Other specified abnormal uterine and vaginal bleeding: Secondary | ICD-10-CM | POA: Diagnosis present

## 2018-02-14 DIAGNOSIS — O039 Complete or unspecified spontaneous abortion without complication: Secondary | ICD-10-CM

## 2018-02-14 DIAGNOSIS — Z3A01 Less than 8 weeks gestation of pregnancy: Secondary | ICD-10-CM | POA: Diagnosis not present

## 2018-02-14 LAB — CBC WITH DIFFERENTIAL/PLATELET
Abs Immature Granulocytes: 0.02 10*3/uL (ref 0.00–0.07)
Basophils Absolute: 0 10*3/uL (ref 0.0–0.1)
Basophils Relative: 1 %
Eosinophils Absolute: 0.1 10*3/uL (ref 0.0–0.5)
Eosinophils Relative: 2 %
HCT: 33.7 % — ABNORMAL LOW (ref 36.0–46.0)
Hemoglobin: 10.4 g/dL — ABNORMAL LOW (ref 12.0–15.0)
IMMATURE GRANULOCYTES: 0 %
Lymphocytes Relative: 37 %
Lymphs Abs: 2.1 10*3/uL (ref 0.7–4.0)
MCH: 24.6 pg — ABNORMAL LOW (ref 26.0–34.0)
MCHC: 30.9 g/dL (ref 30.0–36.0)
MCV: 79.7 fL — AB (ref 80.0–100.0)
Monocytes Absolute: 0.4 10*3/uL (ref 0.1–1.0)
Monocytes Relative: 6 %
Neutro Abs: 3 10*3/uL (ref 1.7–7.7)
Neutrophils Relative %: 54 %
Platelets: 348 10*3/uL (ref 150–400)
RBC: 4.23 MIL/uL (ref 3.87–5.11)
RDW: 16.9 % — ABNORMAL HIGH (ref 11.5–15.5)
WBC: 5.5 10*3/uL (ref 4.0–10.5)
nRBC: 0 % (ref 0.0–0.2)

## 2018-02-14 LAB — HCG, QUANTITATIVE, PREGNANCY: HCG, BETA CHAIN, QUANT, S: 1177 m[IU]/mL — AB (ref <5)

## 2018-02-14 NOTE — ED Notes (Signed)
About [redacted] weeks pregnant. States bleeding x few weeks. States became heavier and clots present today. Sees Dr. Amalia Hailey for OB. 4th pregnancy, 1 living child. No distress noted. A&O.

## 2018-02-14 NOTE — ED Triage Notes (Signed)
approx [redacted] weeks pregnant, bleeding x 2 weeks. Heavier today.

## 2018-02-14 NOTE — ED Provider Notes (Signed)
[facility] Emergency Department Provider Note       Time seen: ----------------------------------------- 5:34 PM on 02/14/2018 -----------------------------------------   I have reviewed the triage vital signs and the nursing notes.  HISTORY   Chief Complaint Vaginal Bleeding    HPI Lindsey Mills is a 42 y.o. female with a history of dysmenorrhea, fibroid, 6-week intrauterine pregnancy with recent bleeding who presents to the ED for heavier bleeding and passing of clots with cramping.  She denies fevers, chills or other complaints.  History reviewed. No pertinent past medical history.  Patient Active Problem List   Diagnosis Date Noted  . Irregular periods/menstrual cycles 05/06/2017  . Oligomenorrhea 05/06/2017  . Dysmenorrhea 04/25/2016  . Menorrhagia with regular cycle 04/25/2016  . Antepartum multigravida of advanced maternal age 15/09/2016  . Cervical incompetence during pregnancy in first trimester 04/04/2016  . History of cesarean section 04/04/2016  . History of myomectomy 04/04/2016  . Uterine leiomyoma 04/04/2016  . Pelvic pain 04/04/2016  . Obesity (BMI 35.0-39.9 without comorbidity) 04/04/2016    Past Surgical History:  Procedure Laterality Date  . CESAREAN SECTION    . DILATION AND CURETTAGE OF UTERUS      Allergies Patient has no known allergies.  Social History Social History   Tobacco Use  . Smoking status: Never Smoker  . Smokeless tobacco: Never Used  Substance Use Topics  . Alcohol use: No  . Drug use: No   Review of Systems Constitutional: Negative for fever. Cardiovascular: Negative for chest pain. Respiratory: Negative for shortness of breath. Gastrointestinal: Positive for abdominal cramping Genitourinary: Positive for vaginal bleeding Musculoskeletal: Negative for back pain. Skin: Negative for rash. Neurological: Negative for headaches, focal weakness or numbness.  All systems  negative/normal/unremarkable except as stated in the HPI  ____________________________________________   PHYSICAL EXAM:  VITAL SIGNS: ED Triage Vitals  Enc Vitals Group     BP 02/14/18 1710 120/69     Pulse Rate 02/14/18 1710 72     Resp 02/14/18 1710 18     Temp 02/14/18 1710 97.9 F (36.6 C)     Temp Source 02/14/18 1710 Oral     SpO2 02/14/18 1710 100 %     Weight 02/14/18 1711 182 lb (82.6 kg)     Height 02/14/18 1711 4\' 11"  (1.499 m)     Head Circumference --      Peak Flow --      Pain Score 02/14/18 1711 9     Pain Loc --      Pain Edu? --      Excl. in [place]? --    Constitutional: Alert and oriented. Well appearing and in no distress. Eyes: Conjunctivae are normal. Normal extraocular movements. Cardiovascular: Normal rate, regular rhythm. No murmurs, rubs, or gallops. Respiratory: Normal respiratory effort without tachypnea nor retractions. Breath sounds are clear and equal bilaterally. No wheezes/rales/rhonchi. Gastrointestinal: Soft and nontender. Normal bowel sounds Musculoskeletal: Nontender with normal range of motion in extremities. No lower extremity tenderness nor edema. Neurologic:  Normal speech and language. No gross focal neurologic deficits are appreciated.  Skin:  Skin is warm, dry and intact. No rash noted. Psychiatric: Mood and affect are normal. Speech and behavior are normal.  ____________________________________________  ED COURSE:  As part of my medical decision making, I reviewed the following data within the [place] History obtained from family if available, nursing notes, old chart and ekg, as well as notes from prior ED visits. Patient presented for persistent vaginal bleeding  in early pregnancy, we will assess with labs as indicated.   Procedures ____________________________________________   LABS (pertinent positives/negatives)  Labs Reviewed  HCG, QUANTITATIVE, PREGNANCY - Abnormal; Notable for the following  components:      Result Value   hCG, Beta Chain, Quant, S 1,177 (*)    All other components within normal limits  CBC WITH DIFFERENTIAL/PLATELET - Abnormal; Notable for the following components:   Hemoglobin 10.4 (*)    HCT 33.7 (*)    MCV 79.7 (*)    MCH 24.6 (*)    RDW 16.9 (*)    All other components within normal limits   ___________________________________________  DIFFERENTIAL DIAGNOSIS   Inevitable miscarriage, threatened miscarriage  FINAL ASSESSMENT AND PLAN  Miscarriage   Plan: The patient had presented for persistent bleeding. Patient's labs did reveal significant decrease in her hCG levels indicating miscarriage.  I did not see any need for ultrasound imaging at this time.  She is cleared for outpatient follow-up on Monday as scheduled.   Laurence Aly, MD   Note: This note was generated in part or whole with voice recognition software. Voice recognition is usually quite accurate but there are transcription errors that can and very often do occur. I apologize for any typographical errors that were not detected and corrected.     Earleen [place], MD 02/14/18 [phone]

## 2018-02-16 ENCOUNTER — Encounter: Payer: 59 | Admitting: Obstetrics and Gynecology

## 2018-02-16 ENCOUNTER — Ambulatory Visit (INDEPENDENT_AMBULATORY_CARE_PROVIDER_SITE_OTHER): Payer: 59 | Admitting: Obstetrics and Gynecology

## 2018-02-16 ENCOUNTER — Ambulatory Visit: Payer: 59 | Admitting: Obstetrics and Gynecology

## 2018-02-16 ENCOUNTER — Ambulatory Visit (INDEPENDENT_AMBULATORY_CARE_PROVIDER_SITE_OTHER): Payer: 59

## 2018-02-16 ENCOUNTER — Other Ambulatory Visit: Payer: Self-pay | Admitting: Obstetrics and Gynecology

## 2018-02-16 ENCOUNTER — Ambulatory Visit: Payer: Self-pay | Admitting: Obstetrics and Gynecology

## 2018-02-16 ENCOUNTER — Encounter: Payer: Self-pay | Admitting: Obstetrics and Gynecology

## 2018-02-16 VITALS — BP 108/74 | HR 88 | Ht 59.0 in | Wt 181.0 lb

## 2018-02-16 DIAGNOSIS — O039 Complete or unspecified spontaneous abortion without complication: Secondary | ICD-10-CM | POA: Diagnosis not present

## 2018-02-16 DIAGNOSIS — Z3491 Encounter for supervision of normal pregnancy, unspecified, first trimester: Secondary | ICD-10-CM

## 2018-02-16 NOTE — Progress Notes (Signed)
HPI:      [name] is a 42 y.o. [phone] who LMP was No LMP recorded (lmp unknown). Patient is pregnant.  Subjective:   She presents today after being seen in the emergency department for miscarriage on Saturday.  Patient passed tissue and showed signs of a completed miscarriage.  She reports that today she is having some bleeding but her cramping and passage of tissue has decreased. Of significant note she has a complicated pregnancy history-this is her second first trimester miscarriage, she has had a previous abdominal surgery for myomectomies, she has had cerclage placement for incompetent cervix and she is 42 years old. She reports that she and her partner have not decided upon future pregnancies at this time.    Hx: The following portions of the patient's history were reviewed and updated as appropriate:             She  has no past medical history on file. She does not have any pertinent problems on file. She  has a past surgical history that includes Cesarean section and Dilation and curettage of uterus. Her family history includes Colon cancer in her father. She  reports that she has never smoked. She has never used smokeless tobacco. She reports that she does not drink alcohol or use drugs. She has a current medication list which includes the following prescription(s): multivitamin-prenatal. She has No Known Allergies.       Review of Systems:  Review of Systems  Constitutional: Denied constitutional symptoms, night sweats, recent illness, fatigue, fever, insomnia and weight loss.  Eyes: Denied eye symptoms, eye pain, photophobia, vision change and visual disturbance.  Ears/Nose/Throat/Neck: Denied ear, nose, throat or neck symptoms, hearing loss, nasal discharge, sinus congestion and sore throat.  Cardiovascular: Denied cardiovascular symptoms, arrhythmia, chest pain/pressure, edema, exercise intolerance, orthopnea and palpitations.  Respiratory: Denied pulmonary  symptoms, asthma, pleuritic pain, productive sputum, cough, dyspnea and wheezing.  Gastrointestinal: Denied, gastro-esophageal reflux, melena, nausea and vomiting.  Genitourinary: See HPI for additional information.  Musculoskeletal: Denied musculoskeletal symptoms, stiffness, swelling, muscle weakness and myalgia.  Dermatologic: Denied dermatology symptoms, rash and scar.  Neurologic: Denied neurology symptoms, dizziness, headache, neck pain and syncope.  Psychiatric: Denied psychiatric symptoms, anxiety and depression.  Endocrine: Denied endocrine symptoms including hot flashes and night sweats.   Meds:   Current Outpatient Medications on File Prior to Visit  Medication Sig Dispense Refill  . Prenatal Vit-Fe Fumarate-FA (MULTIVITAMIN-PRENATAL) 27-0.8 MG TABS tablet Take 1 tablet by mouth daily at 12 noon.     No current facility-administered medications on file prior to visit.     Objective:     Vitals:   02/16/18 1540  BP: 108/74  Pulse: 88                Assessment:    G4P1021 Patient Active Problem List   Diagnosis Date Noted  . Irregular periods/menstrual cycles 05/06/2017  . Oligomenorrhea 05/06/2017  . Dysmenorrhea 04/25/2016  . Menorrhagia with regular cycle 04/25/2016  . Antepartum multigravida of advanced maternal age 59/09/2016  . Cervical incompetence during pregnancy in first trimester 04/04/2016  . History of cesarean section 04/04/2016  . History of myomectomy 04/04/2016  . Uterine leiomyoma 04/04/2016  . Pelvic pain 04/04/2016  . Obesity (BMI 35.0-39.9 without comorbidity) 04/04/2016     1. Complete miscarriage     Patient doing well at this time with minimal bleeding and cramping.   Plan:  1.  We have discussed miscarriage in detail.  Complete miscarriage discussed.  Future pregnancy discussed.  Risk of fibroids advanced maternal age and cervical incompetence discussed.  Possible referral to REI discussed.  Patient and partner would  like to consider all the options and will inform us when have reached a decision. Orders No orders of the defined types were placed in this encounter.   No orders of the defined types were placed in this encounter.     F/U  No follow-ups on file. I spent 17 minutes involved in the care of this patient of which greater than 50% was spent discussing the above issues noted and the plan regarding pregnancy loss and future pregnancies.  All questions answered.  Finis Bud, M.D. 02/16/2018 3:43 PM

## 2018-02-24 ENCOUNTER — Other Ambulatory Visit: Payer: 59

## 2018-02-24 ENCOUNTER — Encounter: Payer: 59 | Admitting: Obstetrics and Gynecology

## 2018-03-23 NOTE — Telephone Encounter (Signed)
1 

## 2018-10-06 ENCOUNTER — Ambulatory Visit: Payer: 59 | Admitting: Obstetrics and Gynecology

## 2018-10-06 ENCOUNTER — Encounter: Payer: Self-pay | Admitting: Obstetrics and Gynecology

## 2018-10-06 ENCOUNTER — Other Ambulatory Visit: Payer: Self-pay

## 2018-10-06 VITALS — BP 116/72 | HR 81 | Ht 59.0 in | Wt 170.1 lb

## 2018-10-06 DIAGNOSIS — Z9889 Other specified postprocedural states: Secondary | ICD-10-CM

## 2018-10-06 DIAGNOSIS — O3429 Maternal care due to uterine scar from other previous surgery: Secondary | ICD-10-CM | POA: Diagnosis not present

## 2018-10-06 DIAGNOSIS — O09521 Supervision of elderly multigravida, first trimester: Secondary | ICD-10-CM | POA: Diagnosis not present

## 2018-10-06 DIAGNOSIS — N926 Irregular menstruation, unspecified: Secondary | ICD-10-CM

## 2018-10-06 LAB — POCT URINE PREGNANCY: Preg Test, Ur: POSITIVE — AB

## 2018-10-06 NOTE — Progress Notes (Signed)
Patient comes in today for pregnancy confirmation. LMP was 08/23/2018. EDD 05/30/2019. She has had some nausea and breast tenderness. Patient was not taking birth control to prevent pregnancy.

## 2018-10-06 NOTE — Progress Notes (Signed)
HPI:      [name] is a 43 y.o. [phone] who LMP was Patient's last menstrual period was 08/23/2018.  Subjective:   She presents today for pregnancy confirmation.  She says she is not attempting pregnancy.  She has nausea some parts of the day but is able to keep food down.   Patient has a history of infertility and was on Clomid in the past but did not conceive.  She has had 3 prior pregnancies her first pregnancy ended in first trimester miscarriage. She has had a multiple myomectomy performed by an infertility doctor.  She subsequently became pregnant and at approximately 20 weeks was noted to have cervical incompetence and underwent a cerclage.  Patient states the doctor told her the membranes were visible at the time of surgery.  She also underwent a primary cesarean delivery because of her previous uterine scarring at myomectomy.  The cerclage was removed 1 week later. Her first pregnancy was also complicated by multiple hospital admissions for vomiting and dehydration.   She is not experienced any significant issues with vomiting during this pregnancy so far.  She is taking prenatal vitamins.   Also of significant note patient is advanced maternal age.    Hx: The following portions of the patient's history were reviewed and updated as appropriate:             She  has no past medical history on file. She does not have any pertinent problems on file. She  has a past surgical history that includes Cesarean section and Dilation and curettage of uterus. Her family history includes Colon cancer in her father. She  reports that she has never smoked. She has never used smokeless tobacco. She reports that she does not drink alcohol or use drugs. She has a current medication list which includes the following prescription(s): multivitamin-prenatal. She has No Known Allergies.       Review of Systems:  Review of Systems  Constitutional: Denied constitutional symptoms, night  sweats, recent illness, fatigue, fever, insomnia and weight loss.  Eyes: Denied eye symptoms, eye pain, photophobia, vision change and visual disturbance.  Ears/Nose/Throat/Neck: Denied ear, nose, throat or neck symptoms, hearing loss, nasal discharge, sinus congestion and sore throat.  Cardiovascular: Denied cardiovascular symptoms, arrhythmia, chest pain/pressure, edema, exercise intolerance, orthopnea and palpitations.  Respiratory: Denied pulmonary symptoms, asthma, pleuritic pain, productive sputum, cough, dyspnea and wheezing.  Gastrointestinal: Denied, gastro-esophageal reflux, melena, nausea and vomiting.  Genitourinary: Denied genitourinary symptoms including symptomatic vaginal discharge, pelvic relaxation issues, and urinary complaints.  Musculoskeletal: Denied musculoskeletal symptoms, stiffness, swelling, muscle weakness and myalgia.  Dermatologic: Denied dermatology symptoms, rash and scar.  Neurologic: Denied neurology symptoms, dizziness, headache, neck pain and syncope.  Psychiatric: Denied psychiatric symptoms, anxiety and depression.  Endocrine: Denied endocrine symptoms including hot flashes and night sweats.   Meds:   Current Outpatient Medications on File Prior to Visit  Medication Sig Dispense Refill  . Prenatal Vit-Fe Fumarate-FA (MULTIVITAMIN-PRENATAL) 27-0.8 MG TABS tablet Take 1 tablet by mouth daily at 12 noon.     No current facility-administered medications on file prior to visit.     Objective:     Vitals:   10/06/18 1027  BP: 116/72  Pulse: 81                Assessment:    G4P1021 Patient Active Problem List   Diagnosis Date Noted  . Irregular periods/menstrual cycles 05/06/2017  . Oligomenorrhea 05/06/2017  . Dysmenorrhea 04/25/2016  .  Menorrhagia with regular cycle 04/25/2016  . Antepartum multigravida of advanced maternal age 62/09/2016  . Cervical incompetence during pregnancy in first trimester 04/04/2016  . History of cesarean section  04/04/2016  . History of myomectomy 04/04/2016  . Uterine leiomyoma 04/04/2016  . Pelvic pain 04/04/2016  . Obesity (BMI 35.0-39.9 without comorbidity) 04/04/2016     1. Missed menses   2. AMA (advanced maternal age) multigravida 50+, first trimester   3. Hx of uterine surgery affecting current pregnancy   4. History of cervical cerclage        Plan:            Prenatal Plan 1.  We discussed advanced maternal age in detail.  Patient desires genetic testing. 2.  She was continued on prenatal vitamins. 3.  A prenatal lab panel was ordered or drawn. 4.  An ultrasound was ordered to better determine an EDC. 5.  A nurse visit was scheduled. 6.  Genetic testing and testing for other inheritable conditions discussed in detail. She will decide in the future whether to have these labs performed. 7.  A general overview of pregnancy testing, visit schedule, ultrasound schedule, and prenatal care was discussed. 8.  Repeat cerclage was discussed and patient is considering this as an option. 9.  History of miscarriage discussed and reviewed 10.  History of prior cesarean delivery and previous uterine surgery requiring early cesarean delivery discussed.   Orders Orders Placed This Encounter  Procedures  . US OB Comp + 14 Wk    No orders of the defined types were placed in this encounter.     F/U  Return in about 6 weeks (around 11/17/2018). I spent 31 minutes involved in the care of this patient of which greater than 50% was spent discussing all the above issues associated with advanced maternal age, cervical cerclage, previous cesarean delivery, multiple miscarriages.  All questions answered.  Finis Bud, M.D. 10/06/2018 11:14 AM

## 2018-10-06 NOTE — Addendum Note (Signed)
Addended by: Durwin Glaze on: 10/06/2018 03:58 PM   Modules accepted: Orders

## 2018-10-06 NOTE — Addendum Note (Signed)
Addended by: Durwin Glaze on: 10/06/2018 03:59 PM   Modules accepted: Orders

## 2018-10-08 ENCOUNTER — Other Ambulatory Visit: Payer: Self-pay

## 2018-10-08 ENCOUNTER — Ambulatory Visit (INDEPENDENT_AMBULATORY_CARE_PROVIDER_SITE_OTHER): Payer: 59

## 2018-10-08 DIAGNOSIS — Z363 Encounter for antenatal screening for malformations: Secondary | ICD-10-CM

## 2018-10-08 DIAGNOSIS — N926 Irregular menstruation, unspecified: Secondary | ICD-10-CM

## 2018-10-08 DIAGNOSIS — Z9889 Other specified postprocedural states: Secondary | ICD-10-CM

## 2018-10-08 DIAGNOSIS — O3429 Maternal care due to uterine scar from other previous surgery: Secondary | ICD-10-CM

## 2018-10-20 ENCOUNTER — Other Ambulatory Visit: Payer: Self-pay

## 2018-10-20 ENCOUNTER — Other Ambulatory Visit: Payer: Self-pay | Admitting: Certified Nurse Midwife

## 2018-10-20 ENCOUNTER — Ambulatory Visit (INDEPENDENT_AMBULATORY_CARE_PROVIDER_SITE_OTHER): Payer: 59

## 2018-10-20 DIAGNOSIS — Z3491 Encounter for supervision of normal pregnancy, unspecified, first trimester: Secondary | ICD-10-CM

## 2018-10-22 ENCOUNTER — Telehealth: Payer: Self-pay | Admitting: Obstetrics and Gynecology

## 2018-10-22 MED ORDER — DOXYLAMINE-PYRIDOXINE 10-10 MG PO TBEC: 10.0000 mg | DELAYED_RELEASE_TABLET | Freq: Every day | ORAL | 1 refills | Status: DC

## 2018-10-22 NOTE — Telephone Encounter (Signed)
The patient called and stated that she cannot keep any food down and is constantly vomiting. The patient is hoping that a prescription can be sent into her pharmacy. Please advise.

## 2018-10-22 NOTE — Telephone Encounter (Signed)
Prescription for nausea sent to pharmacy. LM for patient that prescription had been sent in.

## 2018-10-23 ENCOUNTER — Telehealth: Payer: Self-pay | Admitting: Obstetrics and Gynecology

## 2018-10-23 NOTE — Telephone Encounter (Signed)
The patient called and requested a call back from her nurse, The patient did not disclose any other information. Please advise.

## 2018-10-23 NOTE — Telephone Encounter (Signed)
Spoke with patient and she is having some nausea. Prescription was sent to the pharmacy. She will pick this up and start eating small meals. She will let us know if she has any problems.

## 2018-10-26 ENCOUNTER — Telehealth: Payer: Self-pay | Admitting: Obstetrics and Gynecology

## 2018-10-26 NOTE — Telephone Encounter (Signed)
The patient called and stated that she is still feeling the same as last week with vomiting and feeling nauseas and would like tjo speak with a nurse if possible today. Please advise.

## 2018-10-27 NOTE — Telephone Encounter (Signed)
I sent in nausea medication last week for her. It does not look like it is helping. Is there something else you would like me to do?

## 2018-10-28 ENCOUNTER — Telehealth: Payer: Self-pay | Admitting: Obstetrics and Gynecology

## 2018-10-28 NOTE — Telephone Encounter (Signed)
The patient called requesting a message be sent back to Dr. Amalia Hailey in regards to her needing to speak with him in regards to her wanting/needing to take a few days off work due to the way she's been feeling. Please advise.

## 2018-10-29 ENCOUNTER — Telehealth: Payer: Self-pay | Admitting: Obstetrics and Gynecology

## 2018-10-29 NOTE — Telephone Encounter (Signed)
I called and spoke with patient and informed pt that our office has received her short term disability paperwork via fax, I also informed pt 25.00 form fee is required to process paperwork, with a 7-10 day wait for completion by provider. Pt voiced understanding and only scheduled nurse visit. Thank you.

## 2018-11-03 ENCOUNTER — Other Ambulatory Visit: Payer: Self-pay

## 2018-11-03 ENCOUNTER — Ambulatory Visit (INDEPENDENT_AMBULATORY_CARE_PROVIDER_SITE_OTHER): Payer: 59 | Admitting: Obstetrics and Gynecology

## 2018-11-03 VITALS — BP 109/74 | HR 83 | Ht 59.0 in | Wt 163.4 lb

## 2018-11-03 DIAGNOSIS — Z3481 Encounter for supervision of other normal pregnancy, first trimester: Secondary | ICD-10-CM

## 2018-11-03 NOTE — Progress Notes (Signed)
Lindsey Mills presents for NOB nurse interview visit. Pregnancy confirmation done  10/06/18 EWC.  G- 4.  P-1 0 2 1    . Pregnancy education material explained and given. 0 cats in the home. NOB labs ordered. HIV labs and Drug screen were explained optional and she did not decline. Drug screen ordered. PNV encouraged. Genetic screening options discussed. Genetic testing: Ordered.  Pt may discuss with provider. Pt. To follow up with provider in _1_ weeks for NOB physical.     Pt was very upset and crying- states she feels very lost. States she has sent several messages wishing to speak with the doctor but has not been able to. She was reassured and a lot of the visit was spent answering her questions and reassuring her that we strive to give the best care possible. She was very thankful and appreciative.

## 2018-11-04 ENCOUNTER — Telehealth: Payer: Self-pay

## 2018-11-04 LAB — MONITOR DRUG PROFILE 14(MW)
Amphetamine Scrn, Ur: NEGATIVE ng/mL
BARBITURATE SCREEN URINE: NEGATIVE ng/mL
BENZODIAZEPINE SCREEN, URINE: NEGATIVE ng/mL
Buprenorphine, Urine: NEGATIVE ng/mL
CANNABINOIDS UR QL SCN: NEGATIVE ng/mL
Cocaine (Metab) Scrn, Ur: NEGATIVE ng/mL
Creatinine(Crt), U: 150.4 mg/dL (ref 20.0–300.0)
Fentanyl, Urine: NEGATIVE pg/mL
Meperidine Screen, Urine: NEGATIVE ng/mL
Methadone Screen, Urine: NEGATIVE ng/mL
OXYCODONE+OXYMORPHONE UR QL SCN: NEGATIVE ng/mL
Opiate Scrn, Ur: NEGATIVE ng/mL
Ph of Urine: 5.9 (ref 4.5–8.9)
Phencyclidine Qn, Ur: NEGATIVE ng/mL
Propoxyphene Scrn, Ur: NEGATIVE ng/mL
SPECIFIC GRAVITY: 1.018
Tramadol Screen, Urine: NEGATIVE ng/mL

## 2018-11-04 LAB — URINALYSIS, ROUTINE W REFLEX MICROSCOPIC
Bilirubin, UA: NEGATIVE
Glucose, UA: NEGATIVE
Ketones, UA: NEGATIVE
Leukocytes,UA: NEGATIVE
Nitrite, UA: NEGATIVE
Protein,UA: NEGATIVE
RBC, UA: NEGATIVE
Specific Gravity, UA: 1.019 (ref 1.005–1.030)
Urobilinogen, Ur: 1 mg/dL (ref 0.2–1.0)
pH, UA: 6 (ref 5.0–7.5)

## 2018-11-04 NOTE — Telephone Encounter (Signed)
mychart message sent

## 2018-11-05 LAB — URINE CULTURE

## 2018-11-06 LAB — GC/CHLAMYDIA PROBE AMP
Chlamydia trachomatis, NAA: NEGATIVE
Neisseria Gonorrhoeae by PCR: NEGATIVE

## 2018-11-08 LAB — CBC
Hematocrit: 39.1 % (ref 34.0–46.6)
Hemoglobin: 12.7 g/dL (ref 11.1–15.9)
MCH: 26.3 pg — ABNORMAL LOW (ref 26.6–33.0)
MCHC: 32.5 g/dL (ref 31.5–35.7)
MCV: 81 fL (ref 79–97)
Platelets: 355 10*3/uL (ref 150–450)
RBC: 4.83 x10E6/uL (ref 3.77–5.28)
RDW: 15 % (ref 11.7–15.4)
WBC: 5.2 10*3/uL (ref 3.4–10.8)

## 2018-11-08 LAB — HIV ANTIBODY (ROUTINE TESTING W REFLEX): HIV Screen 4th Generation wRfx: NONREACTIVE

## 2018-11-08 LAB — MATERNIT 21 PLUS CORE, BLOOD
Fetal Fraction: 8
Result (T21): NEGATIVE
Trisomy 13 (Patau syndrome): NEGATIVE
Trisomy 18 (Edwards syndrome): NEGATIVE
Trisomy 21 (Down syndrome): NEGATIVE

## 2018-11-08 LAB — ABO AND RH: Rh Factor: POSITIVE

## 2018-11-08 LAB — RPR: RPR Ser Ql: NONREACTIVE

## 2018-11-08 LAB — RUBELLA SCREEN: Rubella Antibodies, IGG: 14.1 index (ref >0.99)

## 2018-11-08 LAB — HEPATITIS B SURFACE ANTIGEN: Hepatitis B Surface Ag: NEGATIVE

## 2018-11-08 LAB — VARICELLA ZOSTER ANTIBODY, IGG: Varicella zoster IgG: >4000 index (ref >165)

## 2018-11-08 LAB — ANTIBODY SCREEN: Antibody Screen: NEGATIVE

## 2018-11-12 ENCOUNTER — Other Ambulatory Visit (INDEPENDENT_AMBULATORY_CARE_PROVIDER_SITE_OTHER): Payer: 59

## 2018-11-12 ENCOUNTER — Other Ambulatory Visit: Payer: Self-pay

## 2018-11-12 ENCOUNTER — Ambulatory Visit (INDEPENDENT_AMBULATORY_CARE_PROVIDER_SITE_OTHER): Payer: 59 | Admitting: Obstetrics and Gynecology

## 2018-11-12 ENCOUNTER — Telehealth: Payer: Self-pay | Admitting: Obstetrics and Gynecology

## 2018-11-12 ENCOUNTER — Encounter: Payer: Self-pay | Admitting: Obstetrics and Gynecology

## 2018-11-12 VITALS — BP 118/80 | HR 97 | Wt 159.7 lb

## 2018-11-12 DIAGNOSIS — N8311 Corpus luteum cyst of right ovary: Secondary | ICD-10-CM | POA: Diagnosis not present

## 2018-11-12 DIAGNOSIS — Z3A11 11 weeks gestation of pregnancy: Secondary | ICD-10-CM

## 2018-11-12 DIAGNOSIS — O209 Hemorrhage in early pregnancy, unspecified: Secondary | ICD-10-CM

## 2018-11-12 DIAGNOSIS — O3481 Maternal care for other abnormalities of pelvic organs, first trimester: Secondary | ICD-10-CM | POA: Diagnosis not present

## 2018-11-12 NOTE — Telephone Encounter (Signed)
Pt stated that she was taking a shower and noticed a lot of blood in the tub. Pt was really upset and was crying a lot. Pt was advise to keep her appointment with DJE at [facility]. Pt also was advised to have someone drive her to the office due to the pt being really upset, crying and was unable to breath. Pt stated that she was not in any pain.

## 2018-11-12 NOTE — Telephone Encounter (Signed)
The patient called and stated that she needs to speak with a nurse immediately, Pt was breathing heavy on phone and hyperventilating. Transferred call to nurse FH. Thank you.

## 2018-11-12 NOTE — Telephone Encounter (Signed)
This was taking care of through My chart message by DM. I was not in the office on this date.

## 2018-11-12 NOTE — Telephone Encounter (Signed)
Spoke with patient to let her know that the prescription was already at the pharmacy. She just needs to call them to have them fill. Patient stated that she feels better.

## 2018-11-12 NOTE — Telephone Encounter (Signed)
Patient called requesting a script for [facility] [place].Thanks

## 2018-11-12 NOTE — Addendum Note (Signed)
Addended by: Finis Bud on: 11/12/2018 12:05 PM   Modules accepted: Level of Service

## 2018-11-12 NOTE — Progress Notes (Addendum)
HPI:      [name] is a 43 y.o. [phone] who LMP was Patient's last menstrual period was 08/23/2018 (exact date).  Subjective:   She presents today stating that when she was in the shower this morning she began to have bright red vaginal bleeding running down her leg.  She immediately called the office and came in for her appointment.  Since that time she reports that her bleeding has been minimal.  She denies passage of clots or tissue.  She denies pelvic cramping. She has had 2 ultrasounds in the first trimester revealing no abnormalities and confirming a crown-rump length with fetal heart tones.  She is currently 11 weeks estimated gestational age. In addition she complains of daily nausea and vomiting.  She is able to keep liquids down without issue and some meals but feels sick every day.  She was prescribed Diclegis but decided not to take the medicine because something she read online concerned her regarding side effects.    Hx: The following portions of the patient's history were reviewed and updated as appropriate:             She  has no past medical history on file. She does not have any pertinent problems on file. She  has a past surgical history that includes Cesarean section and Dilation and curettage of uterus. Her family history includes Colon cancer in her father. She  reports that she has never smoked. She has never used smokeless tobacco. She reports that she does not drink alcohol or use drugs. She has a current medication list which includes the following prescription(s): doxylamine-pyridoxine and multivitamin-prenatal. She has No Known Allergies.       Review of Systems:  Review of Systems  Constitutional: Denied constitutional symptoms, night sweats, recent illness, fatigue, fever, insomnia and weight loss.  Eyes: Denied eye symptoms, eye pain, photophobia, vision change and visual disturbance.  Ears/Nose/Throat/Neck: Denied ear, nose, throat or neck  symptoms, hearing loss, nasal discharge, sinus congestion and sore throat.  Cardiovascular: Denied cardiovascular symptoms, arrhythmia, chest pain/pressure, edema, exercise intolerance, orthopnea and palpitations.  Respiratory: Denied pulmonary symptoms, asthma, pleuritic pain, productive sputum, cough, dyspnea and wheezing.  Gastrointestinal: Denied, gastro-esophageal reflux, melena, nausea and vomiting.  Genitourinary: See HPI for additional information.  Musculoskeletal: Denied musculoskeletal symptoms, stiffness, swelling, muscle weakness and myalgia.  Dermatologic: Denied dermatology symptoms, rash and scar.  Neurologic: Denied neurology symptoms, dizziness, headache, neck pain and syncope.  Psychiatric: Denied psychiatric symptoms, anxiety and depression.  Endocrine: Denied endocrine symptoms including hot flashes and night sweats.   Meds:   Current Outpatient Medications on File Prior to Visit  Medication Sig Dispense Refill  . Doxylamine-Pyridoxine 10-10 MG TBEC Take 10 mg by mouth daily. (Patient not taking: Reported on 11/03/2018) 60 tablet 1  . Prenatal Vit-Fe Fumarate-FA (MULTIVITAMIN-PRENATAL) 27-0.8 MG TABS tablet Take 1 tablet by mouth daily at 12 noon.     No current facility-administered medications on file prior to visit.     Objective:     Vitals:   11/12/18 0924  BP: 118/80  Pulse: 97                Assessment:    G4P1021 Patient Active Problem List   Diagnosis Date Noted  . Irregular periods/menstrual cycles 05/06/2017  . Oligomenorrhea 05/06/2017  . Dysmenorrhea 04/25/2016  . Menorrhagia with regular cycle 04/25/2016  . Antepartum multigravida of advanced maternal age 33/09/2016  . Cervical incompetence during pregnancy in first trimester 04/04/2016  .  History of cesarean section 04/04/2016  . History of myomectomy 04/04/2016  . Uterine leiomyoma 04/04/2016  . Pelvic pain 04/04/2016  . Obesity (BMI 35.0-39.9 without comorbidity) 04/04/2016     1.  Bleeding in early pregnancy     Bleeding seems to have decreased or resolved at this time   Plan:            1.  Ultrasound to confirm viability of pregnancy  2.  Discussed nausea and vomiting of pregnancy in detail.  Brat diet, clear liquids, frequent small meals, bland foods, use of Diclegis discussed in detail. Orders No orders of the defined types were placed in this encounter.   No orders of the defined types were placed in this encounter.     F/U  Return in about 1 week (around 11/19/2018). I spent 27 minutes involved in the care of this patient of which greater than 50% was spent discussing bleeding in early pregnancy, history of miscarriage, work-up for bleeding, nausea and vomiting in early pregnancy.  All questions answered.  Finis Bud, M.D. 11/12/2018 10:09 AM   Addendum:  Patient had an ultrasound which revealed a viable intrauterine pregnancy with appropriate gestational age and fetal heartbeat.  No evidence of bleeding was noted i.e. no subchorionic hemorrhage etc. I again spoke with the patient regarding this finding and tried to reassure her regarding her bleeding. I have discussed home rest over the next few days and specifically pelvic rest. She is interested in cerclage placement as she had a cerclage with previous pregnancy for possible cervical incompetence. We will plan new OB/preop exam next week with subsequent cerclage scheduling at 13-15 weeks estimated gestational age.

## 2018-11-12 NOTE — Telephone Encounter (Signed)
You spoke with patient.

## 2018-11-17 ENCOUNTER — Encounter: Payer: Self-pay | Admitting: Obstetrics and Gynecology

## 2018-11-25 ENCOUNTER — Ambulatory Visit (INDEPENDENT_AMBULATORY_CARE_PROVIDER_SITE_OTHER): Payer: 59 | Admitting: Obstetrics and Gynecology

## 2018-11-25 ENCOUNTER — Encounter: Payer: Self-pay | Admitting: Obstetrics and Gynecology

## 2018-11-25 ENCOUNTER — Other Ambulatory Visit: Payer: Self-pay

## 2018-11-25 VITALS — BP 112/76 | HR 84 | Ht 59.0 in | Wt 158.6 lb

## 2018-11-25 DIAGNOSIS — Z3A14 14 weeks gestation of pregnancy: Secondary | ICD-10-CM | POA: Diagnosis not present

## 2018-11-25 DIAGNOSIS — Z3481 Encounter for supervision of other normal pregnancy, first trimester: Secondary | ICD-10-CM | POA: Diagnosis not present

## 2018-11-25 LAB — POCT URINALYSIS DIPSTICK OB
Bilirubin, UA: NEGATIVE
Blood, UA: NEGATIVE
Glucose, UA: NEGATIVE
Ketones, UA: NEGATIVE
Leukocytes, UA: NEGATIVE
Nitrite, UA: NEGATIVE
Spec Grav, UA: 1.01 (ref 1.010–1.025)
Urobilinogen, UA: 0.2 E.U./dL
pH, UA: 6.5 (ref 5.0–8.0)

## 2018-11-25 NOTE — Progress Notes (Signed)
NOB: She continues to experience daily nausea and vomiting.  Has tried Diclegis without success, has tried sea bands without success.  No ketones are noted and she says she is keeping some liquids down and some food.  We have still not received records regarding her previous cerclage and she has not decided upon whether or not she wants a repeat cerclage performed.  She is unsure of the story regarding her prior cerclage other than she received it after a pelvic exam revealed her "cervix was open".  Gestational age at the time of that cerclage was unknown.  We have sent for records again and I will schedule her for a preop discussion next week with the intent of performing a cerclage at 14-15 weeks if she so desires.  She says she will base her decision on the records from her previous cerclage and my recommendation.  Risks of cerclage versus continued expectant management discussed in detail.  Ultrasound use for cervical length discussed.  Physical examination General NAD, Conversant  HEENT Atraumatic; Op clear with mmm.  Normo-cephalic. Pupils reactive. Anicteric sclerae  Thyroid/Neck Smooth without nodularity or enlargement. Normal ROM.  Neck Supple.  Skin No rashes, lesions or ulceration. Normal palpated skin turgor. No nodularity.  Breasts: No masses or discharge.  Symmetric.  No axillary adenopathy.  Lungs: Clear to auscultation.No rales or wheezes. Normal Respiratory effort, no retractions.  Heart: NSR.  No murmurs or rubs appreciated. No periferal edema  Abdomen: Soft.  Non-tender.  No masses.  No HSM. No hernia  Extremities: Moves all appropriately.  Normal ROM for age. No lymphadenopathy.  Neuro: Oriented to PPT.  Normal mood. Normal affect.     Pelvic:   Vulva: Normal appearance.  No lesions.  Vagina: No lesions or abnormalities noted.  Support: Normal pelvic support.  Urethra No masses tenderness or scarring.  Meatus Normal size without lesions or prolapse.  Cervix: Normal appearance.   No lesions.  Anus: Normal exam.  No lesions.  Perineum: Normal exam.  No lesions.        Bimanual   Adnexae: No masses.  Non-tender to palpation.  Uterus: Enlarged. 14wks  Non-tender.  Mobile.  AV.  POS FHT's  Adnexae: No masses.  Non-tender to palpation.  Cul-de-sac: Negative for abnormality.  Adnexae: No masses.  Non-tender to palpation.         Pelvimetry   Diagonal: Reached.  Spines: Average.  Sacrum: Concave.  Pubic Arch: Normal.

## 2018-11-25 NOTE — Progress Notes (Signed)
Patient comes in today for new OB physical. She is having some breast tenderness and nausea. She is taking prenatal vitamins.

## 2018-12-01 ENCOUNTER — Other Ambulatory Visit
Admission: RE | Admit: 2018-12-01 | Discharge: 2018-12-01 | Disposition: A | Discharge status: Home / Self Care | Payer: 59 | Source: Ambulatory Visit | Attending: Obstetrics and Gynecology | Admitting: Obstetrics and Gynecology

## 2018-12-01 ENCOUNTER — Other Ambulatory Visit: Payer: Self-pay

## 2018-12-01 DIAGNOSIS — Z20828 Contact with and (suspected) exposure to other viral communicable diseases: Secondary | ICD-10-CM | POA: Insufficient documentation

## 2018-12-01 DIAGNOSIS — Z01812 Encounter for preprocedural laboratory examination: Secondary | ICD-10-CM | POA: Insufficient documentation

## 2018-12-01 LAB — SARS CORONAVIRUS 2 (TAT 6-24 HRS): SARS Coronavirus 2: NEGATIVE

## 2018-12-02 ENCOUNTER — Encounter: Payer: 59 | Admitting: Obstetrics and Gynecology

## 2018-12-03 ENCOUNTER — Encounter: Payer: Self-pay | Admitting: Obstetrics & Gynecology

## 2018-12-03 NOTE — H&P (Signed)
Preoperative History and Physical  Lindsey Mills is a 43 y.o. [id number] here for surgical management of history of cervical insufficiency, currently pregnant , at 14weeks 5days by her LMP: Patient's last menstrual period was 08/23/2018 (exact date). Estimated Date of Delivery: 05/30/19 Ultrasounds on 10/20/18 and 11/24/18 confirm this date.  No significant preoperative concerns.  Proposed surgery:  Cervical cerclage  History reviewed. No pertinent past medical history. Past Surgical History:  Procedure Laterality Date  . CERVICAL CERCLAGE    . CESAREAN SECTION    . DILATION AND CURETTAGE OF UTERUS    . MYOMECTOMY     OB History  Gravida Para Term Preterm AB Living  4 1 1   2 1   SAB TAB Ectopic Multiple Live Births  1       1    # Outcome Date GA Lbr Len/2nd Weight Sex Delivery Anes PTL Lv  4 Current           3 Term 2015   2771 g F CS-LTranv   LIV  2 SAB 2013          1 AB 2000          .   No current facility-administered medications on file prior to encounter.    Current Outpatient Medications on File Prior to Encounter  Medication Sig Dispense Refill  . Doxylamine-Pyridoxine 10-10 MG TBEC Take 10 mg by mouth daily. 60 tablet 1  . famotidine (PEPCID) 20 MG tablet Take 20 mg by mouth at bedtime.    . ondansetron (ZOFRAN-ODT) 4 MG disintegrating tablet Take 4 mg by mouth every 8 (eight) hours as needed for nausea or vomiting.    . Prenatal Vit-Fe Fumarate-FA (MULTIVITAMIN-PRENATAL) 27-0.8 MG TABS tablet Take 1 tablet by mouth daily at 12 noon.     No Known Allergies  Social History:   reports that she has never smoked. She has never used smokeless tobacco. She reports that she does not drink alcohol or use drugs.  Family History  Problem Relation Age of Onset  . Colon cancer Father   . Breast cancer Neg Hx   . Ovarian cancer Neg Hx   . Diabetes Neg Hx   . Heart disease Neg Hx     Review of Systems: Noncontributory  PHYSICAL EXAM: Blood pressure 127/83, pulse  84, temperature 98.3 F (36.8 C), temperature source Tympanic, resp. rate 20, last menstrual period 08/23/2018, SpO2 100 %. General appearance - alert, well appearing, and in no distress Chest - clear to auscultation, no wheezes, rales or rhonchi, symmetric air entry Heart - normal rate and regular rhythm Abdomen - soft, nontender, nondistended, no masses or organomegaly Pelvic - examination not indicated Extremities - peripheral pulses normal, no pedal edema, no clubbing or cyanosis  Labs: Results for orders placed or performed during the hospital encounter of 12/04/18 (from the past 336 hour(s))  Type and screen [place]   Collection Time: 12/04/18  9:44 AM  Result Value Ref Range   ABO/RH(D) O POS    Antibody Screen NEG    Sample Expiration      12/07/2018,2359 Performed at [facility], [address], [place], [place] [address]   ABO/Rh   Collection Time: 12/04/18  9:52 AM  Result Value Ref Range   ABO/RH(D)      Jenetta Downer POS Performed at [facility], [address], [place], [place] [address]   Results for orders placed or performed during the hospital encounter of 12/01/18 (from  the past 336 hour(s))  SARS CORONAVIRUS 2 (TAT 6-24 HRS) Nasopharyngeal Nasopharyngeal Swab   Collection Time: 12/01/18 10:22 AM   Specimen: Nasopharyngeal Swab  Result Value Ref Range   SARS Coronavirus 2 NEGATIVE NEGATIVE  Results for orders placed or performed in visit on 11/25/18 (from the past 336 hour(s))  POC Urinalysis Dipstick OB   Collection Time: 11/25/18  2:20 PM  Result Value Ref Range   Color, UA yellow    Clarity, UA clear    Glucose, UA Negative Negative   Bilirubin, UA neg    Ketones, UA neg    Spec Grav, UA 1.010 1.010 - 1.025   Blood, UA neg    pH, UA 6.5 5.0 - 8.0   POC,PROTEIN,UA Trace Negative, Trace, Small (1+), Moderate (2+), Large (3+), 4+   Urobilinogen, UA 0.2 0.2 or 1.0 E.U./dL   Nitrite, UA neg    Leukocytes, UA Negative  Negative   Appearance     Odor      Imaging Studies: US Ob Comp Less 14 Wks  Result Date: 11/24/2018 Patient Name: Lindsey Mills DOB: 1976/02/22 MRN: [id number] ULTRASOUND REPORT Location: [facility] Date of Service: 11/12/2018 Indications:Threatened AB Findings: Singleton intrauterine pregnancy is visualized with a CRL consistent with [redacted]w[redacted]d gestation, giving an (U/S) EDD of 05/29/2019. The (U/S) EDD is consistent with the clinically established EDD of 05/30/2019. FHR: 161 BPM CRL measurement: 50.3 mm Yolk sac is visualized and appears normal and early anatomy is normal. Amnion: visualized and appears normal Right Ovary is normal in appearance. Left Ovary is normal appearance. Corpus luteal cyst:  Right ovary Survey of the adnexa demonstrates no adnexal masses. There is no free peritoneal fluid in the cul de sac. Impression: 1. [redacted]w[redacted]d Viable Singleton Intrauterine pregnancy by U/S. 2. (U/S) EDD is consistent with Clinically established EDD of 05/30/2019. Recommendations: 1.Clinical correlation with the patient's History and Physical Exam. Gweneth Dimitri, RT The ultrasound images and findings were reviewed by me and I agree with the above report. Finis Bud, M.D. 11/24/2018 9:24 AM    Assessment: Patient Active Problem List   Diagnosis Date Noted  . History of cervical cerclage, currently pregnant 04/04/2016   Fetal viability confirmed by ultrasound, FHR 160  Plan: Patient will undergo surgical management with history-indicated cervical cerclage.   The risks of surgery were discussed in detail with the patient including but not limited to: bleeding which may require transfusion or reoperation; infection which may require antibiotics; failure to prevent preterm birth; thromboembolic phenomenon, surgical site problems and other postoperative/anesthesia complications. Likelihood of success in alleviating the patient's condition was discussed. Routine postoperative instructions  will be reviewed with the patient and her family in detail after surgery.  The patient concurred with the proposed plan, giving informed written consent for the surgery.  Patient has been NPO since last night she will remain NPO for procedure.  Anesthesia and OR aware. To OR when ready.  ----- Larey Days, MD, [place] Attending Obstetrician and Gynecologist [facility], Department of [facility]

## 2018-12-04 ENCOUNTER — Encounter: Payer: Self-pay | Admitting: *Deleted

## 2018-12-04 ENCOUNTER — Other Ambulatory Visit: Payer: Self-pay

## 2018-12-04 ENCOUNTER — Encounter
Admission: RE | Disposition: A | Discharge status: Home / Self Care | Payer: Self-pay | Source: Home / Self Care | Attending: Obstetrics & Gynecology

## 2018-12-04 ENCOUNTER — Ambulatory Visit
Admission: RE | Admit: 2018-12-04 | Discharge: 2018-12-04 | Disposition: A | Discharge status: Home / Self Care | Payer: 59 | Attending: Obstetrics & Gynecology | Admitting: Obstetrics & Gynecology

## 2018-12-04 ENCOUNTER — Ambulatory Visit: Payer: 59 | Admitting: Certified Registered Nurse Anesthetist

## 2018-12-04 DIAGNOSIS — Z3A14 14 weeks gestation of pregnancy: Secondary | ICD-10-CM | POA: Diagnosis not present

## 2018-12-04 DIAGNOSIS — O3432 Maternal care for cervical incompetence, second trimester: Secondary | ICD-10-CM | POA: Diagnosis not present

## 2018-12-04 DIAGNOSIS — O09299 Supervision of pregnancy with other poor reproductive or obstetric history, unspecified trimester: Secondary | ICD-10-CM

## 2018-12-04 DIAGNOSIS — Z79899 Other long term (current) drug therapy: Secondary | ICD-10-CM | POA: Insufficient documentation

## 2018-12-04 DIAGNOSIS — Z9889 Other specified postprocedural states: Secondary | ICD-10-CM

## 2018-12-04 HISTORY — PX: CERVICAL CERCLAGE: SHX1329

## 2018-12-04 LAB — TYPE AND SCREEN
ABO/RH(D): O POS
Antibody Screen: NEGATIVE

## 2018-12-04 LAB — ABO/RH: ABO/RH(D): O POS

## 2018-12-04 SURGERY — CERCLAGE, CERVIX, VAGINAL APPROACH
Anesthesia: General

## 2018-12-04 MED ORDER — LACTATED RINGERS IV SOLN
INTRAVENOUS | Status: DC
  Administered 2018-12-04: 11:00:00 via INTRAVENOUS

## 2018-12-04 MED ORDER — PROMETHAZINE HCL 25 MG/ML IJ SOLN
12.5000 mg | Freq: Once | INTRAMUSCULAR | Status: AC
  Administered 2018-12-04: 13:00:00 12.5 mg via INTRAVENOUS

## 2018-12-04 MED ORDER — MIDAZOLAM HCL 5 MG/5ML IJ SOLN
INTRAMUSCULAR | Status: DC | PRN
  Administered 2018-12-04: 2 mg via INTRAVENOUS

## 2018-12-04 MED ORDER — ONDANSETRON HCL 4 MG/2ML IJ SOLN
4.0000 mg | Freq: Once | INTRAMUSCULAR | Status: AC | PRN
  Administered 2018-12-04: 13:00:00 4 mg via INTRAVENOUS

## 2018-12-04 MED ORDER — MIDAZOLAM HCL 2 MG/2ML IJ SOLN
INTRAMUSCULAR | Status: AC
  Filled 2018-12-04: qty 2

## 2018-12-04 MED ORDER — FENTANYL CITRATE (PF) 100 MCG/2ML IJ SOLN
25.0000 ug | INTRAMUSCULAR | Status: DC | PRN
  Administered 2018-12-04 (×4): 25 ug via INTRAVENOUS

## 2018-12-04 MED ORDER — SCOPOLAMINE 1 MG/3DAYS TD PT72: 1.0000 | MEDICATED_PATCH | TRANSDERMAL | 11 refills | Status: DC

## 2018-12-04 MED ORDER — FENTANYL CITRATE (PF) 100 MCG/2ML IJ SOLN
INTRAMUSCULAR | Status: AC
  Administered 2018-12-04: 25 ug via INTRAVENOUS
  Filled 2018-12-04: qty 2

## 2018-12-04 MED ORDER — PROPOFOL 500 MG/50ML IV EMUL
INTRAVENOUS | Status: AC
  Filled 2018-12-04: qty 50

## 2018-12-04 MED ORDER — ONDANSETRON HCL 4 MG/2ML IJ SOLN
INTRAMUSCULAR | Status: AC
  Administered 2018-12-04: 4 mg via INTRAVENOUS
  Filled 2018-12-04: qty 2

## 2018-12-04 MED ORDER — SILVER NITRATE-POT NITRATE 75-25 % EX MISC
CUTANEOUS | Status: DC | PRN
  Administered 2018-12-04: 1 via TOPICAL

## 2018-12-04 MED ORDER — KETAMINE HCL 50 MG/ML IJ SOLN
INTRAMUSCULAR | Status: DC | PRN
  Administered 2018-12-04: 35 mg via INTRAMUSCULAR

## 2018-12-04 MED ORDER — SODIUM CHLORIDE FLUSH 0.9 % IV SOLN
INTRAVENOUS | Status: AC
  Filled 2018-12-04: qty 10

## 2018-12-04 MED ORDER — PROPOFOL 10 MG/ML IV BOLUS
INTRAVENOUS | Status: DC | PRN
  Administered 2018-12-04 (×3): 28 mg via INTRAVENOUS

## 2018-12-04 MED ORDER — PROMETHAZINE HCL 25 MG/ML IJ SOLN
INTRAMUSCULAR | Status: AC
  Administered 2018-12-04: 12.5 mg via INTRAVENOUS
  Filled 2018-12-04: qty 1

## 2018-12-04 MED ORDER — PROPOFOL 500 MG/50ML IV EMUL
INTRAVENOUS | Status: DC | PRN
  Administered 2018-12-04: 100 ug/kg/min via INTRAVENOUS

## 2018-12-04 SURGICAL SUPPLY — 18 items
CANISTER SUCT 1200ML W/VALVE (MISCELLANEOUS) ×2 IMPLANT
CATH ROBINSON RED A/P 16FR (CATHETERS) ×2 IMPLANT
COUNTER NEEDLE 20/40 LG (NEEDLE) ×2 IMPLANT
COVER WAND RF STERILE (DRAPES) ×2 IMPLANT
ELECT REM PT RETURN 9FT ADLT (ELECTROSURGICAL) ×2
ELECTRODE REM PT RTRN 9FT ADLT (ELECTROSURGICAL) ×1 IMPLANT
GLOVE PI ORTHOPRO 6.5 (GLOVE) ×1
GLOVE PI ORTHOPRO STRL 6.5 (GLOVE) ×1 IMPLANT
GLOVE SURG SYN 6.5 ES PF (GLOVE) ×2 IMPLANT
GOWN STRL REUS W/ TWL LRG LVL3 (GOWN DISPOSABLE) ×2 IMPLANT
GOWN STRL REUS W/TWL LRG LVL3 (GOWN DISPOSABLE) ×2
KIT TURNOVER CYSTO (KITS) ×2 IMPLANT
PACK DNC HYST (MISCELLANEOUS) ×2 IMPLANT
PAD OB MATERNITY 4.3X12.25 (PERSONAL CARE ITEMS) ×2 IMPLANT
PAD PREP 24X41 OB/GYN DISP (PERSONAL CARE ITEMS) ×2 IMPLANT
SCRUB CHG 4% DYNA-HEX 4OZ (MISCELLANEOUS) ×2 IMPLANT
SUT POLY BUTTON 15MM (SUTURE) IMPLANT
TAPE MERSILENE 5MM 36 OS-8 WHT (SUTURE) ×2 IMPLANT

## 2018-12-04 NOTE — Discharge Instructions (Signed)
°  Per Dr. Guido Sander instructions,  -expect some cramping -call if any bleeding  -follow up as scheduled    [place]   1) The drugs that you were given will stay in your system until tomorrow so for the next 24 hours you should not:  A) Drive an automobile B) Make any legal decisions C) Drink any alcoholic beverage   2) You may resume regular meals tomorrow.  Today it is better to start with liquids and gradually work up to solid foods.  You may eat anything you prefer, but it is better to start with liquids, then soup and crackers, and gradually work up to solid foods.   3) Please notify your doctor immediately if you have any unusual bleeding, trouble breathing, redness and pain at the surgery site, drainage, fever, or pain not relieved by medication.    4) Additional Instructions:        Please contact your physician with any problems or Same Day Surgery at [phone], Monday through Friday 6 am to 4 pm, or [place] at [facility] number at [phone].

## 2018-12-04 NOTE — Anesthesia Preprocedure Evaluation (Signed)
Anesthesia Evaluation  Patient identified by MRN, date of birth, ID band Patient awake    Reviewed: Allergy & Precautions, H&P , NPO status , Patient's Chart, lab work & pertinent test results, reviewed documented beta blocker date and time   Airway Mallampati: II   Neck ROM: full    Dental  (+) Teeth Intact   Pulmonary neg pulmonary ROS,    Pulmonary exam normal        Cardiovascular Exercise Tolerance: Good negative cardio ROS Normal cardiovascular exam Rhythm:regular Rate:Normal     Neuro/Psych negative neurological ROS  negative psych ROS   GI/Hepatic negative GI ROS, Neg liver ROS,   Endo/Other  negative endocrine ROS  Renal/GU negative Renal ROS  negative genitourinary   Musculoskeletal   Abdominal   Peds  Hematology negative hematology ROS (+)   Anesthesia Other Findings History reviewed. No pertinent past medical history. Past Surgical History: No date: CERVICAL CERCLAGE No date: CESAREAN SECTION No date: DILATION AND CURETTAGE OF UTERUS No date: MYOMECTOMY   Reproductive/Obstetrics negative OB ROS                             Anesthesia Physical Anesthesia Plan  ASA: II  Anesthesia Plan: General   Post-op Pain Management:    Induction:   PONV Risk Score and Plan:   Airway Management Planned:   Additional Equipment:   Intra-op Plan:   Post-operative Plan:   Informed Consent: I have reviewed the patients History and Physical, chart, labs and discussed the procedure including the risks, benefits and alternatives for the proposed anesthesia with the patient or authorized representative who has indicated his/her understanding and acceptance.     Dental Advisory Given  Plan Discussed with: CRNA  Anesthesia Plan Comments: (Will check fetal heart tones pre and post.  Risks to fetus reviewed. Spoke with pt. About risks and benefits of GA and SAB.  She prefers GA  with OB utilizing local from below..  Dr. Leonides Schanz ok with the above plan.  JA)        Anesthesia Quick Evaluation

## 2018-12-04 NOTE — Progress Notes (Signed)
Spoke with Dr. Leonides Schanz regarding discharge instructions. Per Dr. Leonides Schanz pt should expect some cramping; report bleeding; follow up as scheduled. Will add to discharge instructions

## 2018-12-04 NOTE — Anesthesia Post-op Follow-up Note (Signed)
Anesthesia QCDR form completed.        

## 2018-12-04 NOTE — Transfer of Care (Signed)
Immediate Anesthesia Transfer of Care Note  Patient: Lindsey Mills  Procedure(s) Performed: CERCLAGE CERVICAL (N/A )  Patient Location: PACU  Anesthesia Type:General  Level of Consciousness: sedated  Airway & Oxygen Therapy: Patient Spontanous Breathing and Patient connected to nasal cannula oxygen  Post-op Assessment: Report given to RN and Post -op Vital signs reviewed and stable  Post vital signs: Reviewed and stable  Last Vitals:  Vitals Value Taken Time  BP    Temp    Pulse 89 12/04/18 1139  Resp 25 12/04/18 1139  SpO2 100 % 12/04/18 1139  Vitals shown include unvalidated device data.  Last Pain:  Vitals:   12/04/18 0938  TempSrc: Tympanic  PainSc: 0-No pain         Complications: No apparent anesthesia complications

## 2018-12-07 ENCOUNTER — Encounter: Payer: Self-pay | Admitting: Obstetrics & Gynecology

## 2018-12-07 NOTE — Op Note (Addendum)
Lindsey Mills    PROCEDURE DATE: 12/04/2018  PREOPERATIVE DIAGNOSIS: Intrauterine pregnancy at [redacted]w[redacted]d history of cervical insufficiency.   POSTOPERATIVE DIAGNOSIS: The same PROCEDURE: Transvaginal McDonald Cervical Cerclage Placement SURGEON:  Dr. CVikki PortsWard  INDICATIONS: 43y.o. [id number] history of cervical incompetence, here for cerclage placement.   The risks of surgery were discussed in detail with the patient including but not limited to: bleeding; infection which may require antibiotic therapy; injury to cervix, vagina other surrounding organs; risk of ruptured membranes and/or preterm delivery and other postoperative or anesthesia complications.  Written informed consent was obtained.    FINDINGS:  About 3 cm palpable cervical length in the vagina, closed cervix, suture knot placed anteriorly at 12:00.  ANESTHESIA:  MAC INTRAVENOUS FLUIDS: 1000  ml ESTIMATED BLOOD LOSS: 5 ml COMPLICATIONS: None immediate  PROCEDURE IN DETAIL:  The patient had sequential compression devices applied to her lower extremities while in the preoperative area.  Fetal cardiac activity was observed via ultrasound, and location of placenta verified. She was then taken to the operating room where anesthesia was administered and was found to be adequate.  She was placed in the dorsal lithotomy, and was prepped and draped in a sterile manner. After a timeout was performed, a speculum was then placed in the patient's vagina. The anterior and posterior lips of the cervix were grasped with ring forceps. A curved needle loaded with a 5 mm Mersilene suture was inserted at 12 o'clock, as high as possible at the junction of the rugated vaginal epithelium and the smooth cervix, at least 3 cm above the external os.  Five bites are taken circumferentially around the entire cervix in a purse-string fashion. The two ends of the suture were then tied securely anteriorly and cut, leaving the ends long enough to  grasp with a clamp when it is time to remove it. There was minimal bleeding noted and the ring forceps were removed with good hemostasis noted.  All instruments were removed from the patient's vagina.  Instrument, needle and sponge counts were correct x 2. The patient tolerated the procedure well, and was taken to the recovery area awake and in stable condition. Fetal cardiac activity was seen via ultrasound following the procedure, with heart rate within normal limits.  The patient was discharged home following this procedure.  Routine postoperative instructions given. She will follow up in the clinic in one week for postoperative evaluation and ongoing prenatal care  ----- ChLarey DaysMD Attending [facility]st[facility] and [facility]

## 2018-12-09 NOTE — Anesthesia Postprocedure Evaluation (Signed)
Anesthesia Post Note  Patient: [name]  Procedure(s) Performed: CERCLAGE CERVICAL (N/A )  Patient location during evaluation: PACU Anesthesia Type: General Level of consciousness: awake and alert Pain management: pain level controlled Vital Signs Assessment: post-procedure vital signs reviewed and stable Respiratory status: spontaneous breathing, nonlabored ventilation, respiratory function stable and patient connected to nasal cannula oxygen Cardiovascular status: blood pressure returned to baseline and stable Postop Assessment: no apparent nausea or vomiting Anesthetic complications: no     Last Vitals:  Vitals:   12/04/18 1416 12/04/18 1443  BP:  115/68  Pulse: 72 62  Resp: (!) 21 16  Temp: 36.8 C (!) 36.2 C  SpO2: 100% 100%    Last Pain:  Vitals:   12/04/18 1443  TempSrc: Temporal  PainSc: 0-No pain                 Molli Barrows

## 2018-12-23 ENCOUNTER — Encounter: Payer: 59 | Admitting: Obstetrics and Gynecology

## 2019-01-25 ENCOUNTER — Encounter: Payer: Self-pay | Admitting: Obstetrics & Gynecology

## 2019-05-03 ENCOUNTER — Other Ambulatory Visit: Payer: Self-pay

## 2019-05-03 LAB — OB RESULTS CONSOLE GBS: GBS: POSITIVE

## 2019-05-04 ENCOUNTER — Other Ambulatory Visit: Payer: Self-pay

## 2019-05-04 ENCOUNTER — Encounter
Admission: RE | Admit: 2019-05-04 | Discharge: 2019-05-04 | Disposition: A | Discharge status: Home / Self Care | Payer: 59 | Source: Ambulatory Visit | Attending: Obstetrics & Gynecology | Admitting: Obstetrics & Gynecology

## 2019-05-04 DIAGNOSIS — Z01818 Encounter for other preprocedural examination: Secondary | ICD-10-CM | POA: Diagnosis present

## 2019-05-04 HISTORY — DX: Gastro-esophageal reflux disease without esophagitis: K21.9

## 2019-05-04 NOTE — Patient Instructions (Signed)
Your procedure is scheduled on: Monday 05/10/19.  Arrival Time: Please call Labor and Delivery the day before your scheduled C-Section to find out your arrival time. [phone].  Arrival: Arrive to the [facility]. If your arrival time is prior to 6:00am, please call Labor and Delivery [phone] from your cell phone upon arrival and someone from L&D or security will come down to the [place] entrance and escort you to Labor and Delivery. If your arrival time is 6:00am or later, please enter the [facility] and follow the greeter's instructions.   Partner: ONE dedicated partner/guest is allowed to accompany you throughout your hospital stay. They are allowed to accompany you to the operating room. Your guest may leave and return during normal visiting hours, but will have to be re-screened with each entry to the [facility]. Partners are not allowed to switch out for a new guest at any point during your hospital stay.    Remember: Instructions that are not followed completely may result in serious medical risk, up to and including death, or upon the discretion of your surgeon and anesthesiologist your surgery may need to be rescheduled.       _X__ 1. Do not eat food after midnight the night before your procedure.                 No gum chewing or hard candies. You may drink clear liquids up to 2 hours                 before you are scheduled to arrive for your surgery- DO NOT drink clear                 liquids within 2 hours of the start of your surgery.                 Clear Liquids include:  water, apple juice without pulp, clear carbohydrate                 drink such as Clearfast or Gatorade, Black Coffee or Tea (Do not add                 anything to coffee or tea).    __X__2.  On the morning of surgery brush your teeth with toothpaste and water, you may rinse your mouth with mouthwash if you wish.  Do not swallow any toothpaste or mouthwash.   __X__3.  Notify your  doctor if there is any change in your medical condition      (cold, fever, infections).      Do not wear jewelry, make-up, hairpins, clips or nail polish. Do not wear lotions, powders, or perfumes.  Do not shave 48 hours prior to surgery. Men may shave face and neck. Do not bring valuables to the hospital.     [facility] is not responsible for any belongings or valuables.   Contacts, dentures/partials or body piercings may not be worn into surgery. Bring a case for your contacts, glasses or hearing aids, a denture cup will be supplied.   Patients discharged the day of surgery will not be allowed to drive home.    __X__ Take these medicines the morning of surgery with A SIP OF WATER:     1. NONE    __X__ Use CHG Soap/SAGE wipes as directed    __X__ Stop Blood Thinners Coumadin/Plavix/Xarelto/Pleta/Pradaxa/Eliquis/Effient/Aspirin    __X__ Stop Anti-inflammatories 7 days before surgery such as Advil, Ibuprofen, Motrin, BC or Goodies Powder,  Naprosyn, Naproxen, Aleve, Aspirin, Meloxicam. May take Tylenol if needed for pain or discomfort.

## 2019-05-06 ENCOUNTER — Other Ambulatory Visit: Payer: 59

## 2019-05-07 ENCOUNTER — Other Ambulatory Visit: Payer: Self-pay

## 2019-05-07 ENCOUNTER — Other Ambulatory Visit
Admission: RE | Admit: 2019-05-07 | Discharge: 2019-05-07 | Disposition: A | Discharge status: Home / Self Care | Payer: 59 | Source: Ambulatory Visit | Attending: Obstetrics & Gynecology | Admitting: Obstetrics & Gynecology

## 2019-05-07 LAB — BASIC METABOLIC PANEL
Anion gap: 8 (ref 5–15)
BUN: 6 mg/dL (ref 6–20)
CO2: 22 mmol/L (ref 22–32)
Calcium: 8.5 mg/dL — ABNORMAL LOW (ref 8.9–10.3)
Chloride: 106 mmol/L (ref 98–111)
Creatinine, Ser: 0.65 mg/dL (ref 0.44–1.00)
GFR calc Af Amer: >60 mL/min (ref >60)
GFR calc non Af Amer: >60 mL/min (ref >60)
Glucose, Bld: 81 mg/dL (ref 70–99)
Potassium: 3.3 mmol/L — ABNORMAL LOW (ref 3.5–5.1)
Sodium: 136 mmol/L (ref 135–145)

## 2019-05-07 LAB — CBC
HCT: 38 % (ref 36.0–46.0)
Hemoglobin: 12.1 g/dL (ref 12.0–15.0)
MCH: 26.8 pg (ref 26.0–34.0)
MCHC: 31.8 g/dL (ref 30.0–36.0)
MCV: 84.1 fL (ref 80.0–100.0)
Platelets: 298 10*3/uL (ref 150–400)
RBC: 4.52 MIL/uL (ref 3.87–5.11)
RDW: 14.6 % (ref 11.5–15.5)
WBC: 3.8 10*3/uL — ABNORMAL LOW (ref 4.0–10.5)
nRBC: 0 % (ref 0.0–0.2)

## 2019-05-07 LAB — TYPE AND SCREEN
ABO/RH(D): O POS
Antibody Screen: NEGATIVE
Extend sample reason: UNDETERMINED

## 2019-05-07 LAB — SARS CORONAVIRUS 2 (TAT 6-24 HRS): SARS Coronavirus 2: NEGATIVE

## 2019-05-08 LAB — RPR: RPR Ser Ql: NONREACTIVE

## 2019-05-10 ENCOUNTER — Inpatient Hospital Stay: Payer: 59 | Admitting: Registered Nurse

## 2019-05-10 ENCOUNTER — Encounter: Payer: Self-pay | Admitting: Obstetrics & Gynecology

## 2019-05-10 ENCOUNTER — Encounter
Admission: RE | Disposition: A | Discharge status: Home / Self Care | Payer: Self-pay | Source: Home / Self Care | Attending: Obstetrics & Gynecology

## 2019-05-10 ENCOUNTER — Inpatient Hospital Stay
Admission: RE | Admit: 2019-05-10 | Discharge: 2019-05-12 | DRG: 786 | Disposition: A | Discharge status: Home / Self Care | Payer: 59 | Attending: Obstetrics & Gynecology | Admitting: Obstetrics & Gynecology

## 2019-05-10 ENCOUNTER — Other Ambulatory Visit: Payer: Self-pay

## 2019-05-10 DIAGNOSIS — O99824 Streptococcus B carrier state complicating childbirth: Secondary | ICD-10-CM | POA: Diagnosis present

## 2019-05-10 DIAGNOSIS — D252 Subserosal leiomyoma of uterus: Secondary | ICD-10-CM | POA: Diagnosis present

## 2019-05-10 DIAGNOSIS — D62 Acute posthemorrhagic anemia: Secondary | ICD-10-CM | POA: Diagnosis not present

## 2019-05-10 DIAGNOSIS — E669 Obesity, unspecified: Secondary | ICD-10-CM | POA: Diagnosis present

## 2019-05-10 DIAGNOSIS — O99214 Obesity complicating childbirth: Secondary | ICD-10-CM | POA: Diagnosis present

## 2019-05-10 DIAGNOSIS — O34211 Maternal care for low transverse scar from previous cesarean delivery: Secondary | ICD-10-CM | POA: Diagnosis present

## 2019-05-10 DIAGNOSIS — O3433 Maternal care for cervical incompetence, third trimester: Secondary | ICD-10-CM | POA: Diagnosis present

## 2019-05-10 DIAGNOSIS — Z3A37 37 weeks gestation of pregnancy: Secondary | ICD-10-CM

## 2019-05-10 DIAGNOSIS — O9081 Anemia of the puerperium: Secondary | ICD-10-CM | POA: Diagnosis not present

## 2019-05-10 DIAGNOSIS — D251 Intramural leiomyoma of uterus: Secondary | ICD-10-CM | POA: Diagnosis present

## 2019-05-10 DIAGNOSIS — Z98891 History of uterine scar from previous surgery: Secondary | ICD-10-CM

## 2019-05-10 DIAGNOSIS — Z20822 Contact with and (suspected) exposure to covid-19: Secondary | ICD-10-CM | POA: Diagnosis present

## 2019-05-10 DIAGNOSIS — O3413 Maternal care for benign tumor of corpus uteri, third trimester: Secondary | ICD-10-CM | POA: Diagnosis present

## 2019-05-10 HISTORY — PX: CERVICAL CERCLAGE: SHX1329

## 2019-05-10 LAB — OB RESULTS CONSOLE GC/CHLAMYDIA
Chlamydia: NEGATIVE
Gonorrhea: NEGATIVE

## 2019-05-10 SURGERY — Surgical Case
Anesthesia: Spinal | Site: Cervix

## 2019-05-10 MED ORDER — LACTATED RINGERS IV SOLN: Freq: Once | INTRAVENOUS | Status: AC

## 2019-05-10 MED ORDER — FENTANYL CITRATE (PF) 100 MCG/2ML IJ SOLN
INTRAMUSCULAR | Status: DC | PRN
  Administered 2019-05-10: 15 ug via INTRAVENOUS

## 2019-05-10 MED ORDER — LACTATED RINGERS IV SOLN: INTRAVENOUS | Status: DC

## 2019-05-10 MED ORDER — PRENATAL MULTIVITAMIN CH
1.0000 | ORAL_TABLET | Freq: Every day | ORAL | Status: DC
  Filled 2019-05-10 (×2): qty 1

## 2019-05-10 MED ORDER — PROMETHAZINE HCL 25 MG/ML IJ SOLN
INTRAMUSCULAR | Status: DC | PRN
  Administered 2019-05-10: 6.25 mg via INTRAVENOUS

## 2019-05-10 MED ORDER — KETOROLAC TROMETHAMINE 30 MG/ML IJ SOLN: 30.0000 mg | Freq: Once | INTRAMUSCULAR | Status: DC

## 2019-05-10 MED ORDER — WITCH HAZEL-GLYCERIN EX PADS: 1.0000 "application " | MEDICATED_PAD | CUTANEOUS | Status: DC | PRN

## 2019-05-10 MED ORDER — ONDANSETRON HCL 4 MG/2ML IJ SOLN
INTRAMUSCULAR | Status: AC
  Filled 2019-05-10: qty 2

## 2019-05-10 MED ORDER — MENTHOL 3 MG MT LOZG
1.0000 | LOZENGE | OROMUCOSAL | Status: DC | PRN
  Filled 2019-05-10: qty 9

## 2019-05-10 MED ORDER — OXYTOCIN 40 UNITS IN NORMAL SALINE INFUSION - SIMPLE MED
INTRAVENOUS | Status: AC
  Administered 2019-05-10: 2.5 [IU]/h via INTRAVENOUS
  Filled 2019-05-10: qty 1000

## 2019-05-10 MED ORDER — SENNOSIDES-DOCUSATE SODIUM 8.6-50 MG PO TABS
2.0000 | ORAL_TABLET | ORAL | Status: DC
  Administered 2019-05-12: 2 via ORAL
  Filled 2019-05-10: qty 2

## 2019-05-10 MED ORDER — SODIUM CHLORIDE (PF) 0.9 % IJ SOLN
INTRAMUSCULAR | Status: AC
  Filled 2019-05-10: qty 50

## 2019-05-10 MED ORDER — COCONUT OIL OIL
1.0000 "application " | TOPICAL_OIL | Status: DC | PRN
  Administered 2019-05-10: 1 via TOPICAL
  Filled 2019-05-10: qty 120

## 2019-05-10 MED ORDER — PHENYLEPHRINE 40 MCG/ML (10ML) SYRINGE FOR IV PUSH (FOR BLOOD PRESSURE SUPPORT)
PREFILLED_SYRINGE | INTRAVENOUS | Status: DC | PRN
  Administered 2019-05-10: 100 ug via INTRAVENOUS

## 2019-05-10 MED ORDER — SUCCINYLCHOLINE CHLORIDE 200 MG/10ML IV SOSY
PREFILLED_SYRINGE | INTRAVENOUS | Status: AC
  Filled 2019-05-10: qty 10

## 2019-05-10 MED ORDER — KETOROLAC TROMETHAMINE 30 MG/ML IJ SOLN
30.0000 mg | Freq: Four times a day (QID) | INTRAMUSCULAR | Status: AC
  Administered 2019-05-11: 30 mg via INTRAVENOUS
  Filled 2019-05-10: qty 1

## 2019-05-10 MED ORDER — BUPIVACAINE LIPOSOME 1.3 % IJ SUSP
20.0000 mL | INTRAMUSCULAR | Status: DC
  Filled 2019-05-10: qty 20

## 2019-05-10 MED ORDER — OXYTOCIN 40 UNITS IN NORMAL SALINE INFUSION - SIMPLE MED: 2.5000 [IU]/h | INTRAVENOUS | Status: AC

## 2019-05-10 MED ORDER — FENTANYL CITRATE (PF) 100 MCG/2ML IJ SOLN: 25.0000 ug | INTRAMUSCULAR | Status: DC | PRN

## 2019-05-10 MED ORDER — HYDROCODONE-ACETAMINOPHEN 5-325 MG PO TABS: 1.0000 | ORAL_TABLET | Freq: Four times a day (QID) | ORAL | Status: AC | PRN

## 2019-05-10 MED ORDER — FENTANYL CITRATE (PF) 100 MCG/2ML IJ SOLN
INTRAMUSCULAR | Status: AC
  Filled 2019-05-10: qty 2

## 2019-05-10 MED ORDER — NALBUPHINE HCL 10 MG/ML IJ SOLN: 2.5000 mg | Freq: Four times a day (QID) | INTRAMUSCULAR | Status: DC | PRN

## 2019-05-10 MED ORDER — OXYTOCIN 40 UNITS IN NORMAL SALINE INFUSION - SIMPLE MED
INTRAVENOUS | Status: DC | PRN
  Administered 2019-05-10: 1000 mL/h via INTRAVENOUS

## 2019-05-10 MED ORDER — DIPHENHYDRAMINE HCL 25 MG PO CAPS: 25.0000 mg | ORAL_CAPSULE | Freq: Four times a day (QID) | ORAL | Status: DC | PRN

## 2019-05-10 MED ORDER — ACETAMINOPHEN 500 MG PO TABS
1000.0000 mg | ORAL_TABLET | Freq: Four times a day (QID) | ORAL | Status: DC
  Filled 2019-05-10 (×2): qty 2

## 2019-05-10 MED ORDER — SODIUM CHLORIDE 0.9% FLUSH
INTRAVENOUS | Status: DC | PRN
  Administered 2019-05-10: 50 mL

## 2019-05-10 MED ORDER — SODIUM CHLORIDE 0.9% FLUSH
3.0000 mL | Freq: Two times a day (BID) | INTRAVENOUS | Status: DC
  Administered 2019-05-11: 3 mL via INTRAVENOUS

## 2019-05-10 MED ORDER — SOD CITRATE-CITRIC ACID 500-334 MG/5ML PO SOLN
30.0000 mL | ORAL | Status: AC
  Administered 2019-05-10: 30 mL via ORAL
  Filled 2019-05-10: qty 30

## 2019-05-10 MED ORDER — SIMETHICONE 80 MG PO CHEW
160.0000 mg | CHEWABLE_TABLET | Freq: Four times a day (QID) | ORAL | Status: DC | PRN
  Administered 2019-05-11 (×2): 160 mg via ORAL
  Filled 2019-05-10 (×2): qty 2

## 2019-05-10 MED ORDER — CEFAZOLIN SODIUM-DEXTROSE 2-4 GM/100ML-% IV SOLN
2.0000 g | INTRAVENOUS | Status: AC
  Administered 2019-05-10: 08:00:00 2 g via INTRAVENOUS
  Filled 2019-05-10: qty 100

## 2019-05-10 MED ORDER — PHENYLEPHRINE HCL-NACL 20-0.9 MG/250ML-% IV SOLN
INTRAVENOUS | Status: DC | PRN
  Administered 2019-05-10: 50 ug/min via INTRAVENOUS

## 2019-05-10 MED ORDER — BUPIVACAINE HCL (PF) 0.5 % IJ SOLN
30.0000 mL | INTRAMUSCULAR | Status: DC
  Filled 2019-05-10: qty 30

## 2019-05-10 MED ORDER — CARBOPROST TROMETHAMINE 250 MCG/ML IM SOLN
INTRAMUSCULAR | Status: AC
  Filled 2019-05-10: qty 1

## 2019-05-10 MED ORDER — ACETAMINOPHEN 650 MG RE SUPP
650.0000 mg | RECTAL | Status: AC
  Administered 2019-05-10: 650 mg via RECTAL
  Filled 2019-05-10: qty 1

## 2019-05-10 MED ORDER — OXYCODONE HCL 5 MG PO TABS: 5.0000 mg | ORAL_TABLET | ORAL | Status: DC | PRN

## 2019-05-10 MED ORDER — BUPIVACAINE HCL (PF) 0.5 % IJ SOLN
INTRAMUSCULAR | Status: DC | PRN
  Administered 2019-05-10: 30 mL

## 2019-05-10 MED ORDER — KETOROLAC TROMETHAMINE 30 MG/ML IJ SOLN
INTRAMUSCULAR | Status: DC | PRN
  Administered 2019-05-10: 30 mg via INTRAVENOUS

## 2019-05-10 MED ORDER — IBUPROFEN 800 MG PO TABS: 800.0000 mg | ORAL_TABLET | Freq: Four times a day (QID) | ORAL | Status: DC

## 2019-05-10 MED ORDER — LIDOCAINE HCL (PF) 2 % IJ SOLN
INTRAMUSCULAR | Status: AC
  Filled 2019-05-10: qty 5

## 2019-05-10 MED ORDER — DIBUCAINE (PERIANAL) 1 % EX OINT: 1.0000 "application " | TOPICAL_OINTMENT | CUTANEOUS | Status: DC | PRN

## 2019-05-10 MED ORDER — SODIUM CHLORIDE 0.9 % IV SOLN: 250.0000 mL | INTRAVENOUS | Status: DC

## 2019-05-10 MED ORDER — SODIUM CHLORIDE 0.9% FLUSH: 3.0000 mL | INTRAVENOUS | Status: DC | PRN

## 2019-05-10 MED ORDER — MORPHINE SULFATE (PF) 2 MG/ML IV SOLN: 1.0000 mg | INTRAVENOUS | Status: AC | PRN

## 2019-05-10 MED ORDER — ONDANSETRON HCL 4 MG/2ML IJ SOLN
INTRAMUSCULAR | Status: DC | PRN
  Administered 2019-05-10: 4 mg via INTRAVENOUS

## 2019-05-10 MED ORDER — DEXAMETHASONE SODIUM PHOSPHATE 10 MG/ML IJ SOLN
INTRAMUSCULAR | Status: DC | PRN
  Administered 2019-05-10: 10 mg via INTRAVENOUS

## 2019-05-10 MED ORDER — KETOROLAC TROMETHAMINE 30 MG/ML IJ SOLN
INTRAMUSCULAR | Status: AC
  Filled 2019-05-10: qty 1

## 2019-05-10 MED ORDER — MORPHINE SULFATE (PF) 0.5 MG/ML IJ SOLN
INTRAMUSCULAR | Status: AC
  Filled 2019-05-10: qty 10

## 2019-05-10 MED ORDER — PHENYLEPHRINE HCL (PRESSORS) 10 MG/ML IV SOLN
INTRAVENOUS | Status: AC
  Filled 2019-05-10: qty 1

## 2019-05-10 MED ORDER — OXYTOCIN 40 UNITS IN NORMAL SALINE INFUSION - SIMPLE MED
INTRAVENOUS | Status: AC
  Filled 2019-05-10: qty 1000

## 2019-05-10 MED ORDER — ONDANSETRON HCL 4 MG/2ML IJ SOLN: 4.0000 mg | Freq: Once | INTRAMUSCULAR | Status: DC | PRN

## 2019-05-10 MED ORDER — BUPIVACAINE IN DEXTROSE 0.75-8.25 % IT SOLN
INTRATHECAL | Status: DC | PRN
  Administered 2019-05-10: 1.2 mL via INTRATHECAL

## 2019-05-10 MED ORDER — TETRACAINE HCL 1 % IJ SOLN
INTRAMUSCULAR | Status: DC | PRN
  Administered 2019-05-10: 2.5 mg via INTRASPINAL

## 2019-05-10 MED ORDER — BUPIVACAINE LIPOSOME 1.3 % IJ SUSP
INTRAMUSCULAR | Status: DC | PRN
  Administered 2019-05-10: 20 mL

## 2019-05-10 MED ORDER — MORPHINE SULFATE (PF) 0.5 MG/ML IJ SOLN
INTRAMUSCULAR | Status: DC | PRN
  Administered 2019-05-10: 100 ug via EPIDURAL

## 2019-05-10 SURGICAL SUPPLY — 41 items
BINDER ABDOMINAL 12 ML 46-62 (SOFTGOODS) ×4 IMPLANT
CANISTER SUCT 3000ML PPV (MISCELLANEOUS) ×4 IMPLANT
CLOSURE WOUND 1/2 X4 (GAUZE/BANDAGES/DRESSINGS) ×1
COVER WAND RF STERILE (DRAPES) ×4 IMPLANT
DERMABOND ADHESIVE PROPEN (GAUZE/BANDAGES/DRESSINGS) ×4
DERMABOND ADVANCED (GAUZE/BANDAGES/DRESSINGS) ×2
DERMABOND ADVANCED .7 DNX12 (GAUZE/BANDAGES/DRESSINGS) ×2 IMPLANT
DERMABOND ADVANCED .7 DNX6 (GAUZE/BANDAGES/DRESSINGS) ×4 IMPLANT
DRSG TELFA 3X8 NADH (GAUZE/BANDAGES/DRESSINGS) ×4 IMPLANT
ELECT CAUTERY BLADE 6.4 (BLADE) ×4 IMPLANT
ELECT REM PT RETURN 9FT ADLT (ELECTROSURGICAL) ×4
ELECTRODE REM PT RTRN 9FT ADLT (ELECTROSURGICAL) ×2 IMPLANT
GAUZE SPONGE 4X4 12PLY STRL (GAUZE/BANDAGES/DRESSINGS) ×4 IMPLANT
GLOVE PI ORTHOPRO 6.5 (GLOVE) ×2
GLOVE PI ORTHOPRO STRL 6.5 (GLOVE) ×2 IMPLANT
GLOVE SURG SYN 6.5 ES PF (GLOVE) ×8 IMPLANT
GOWN STRL REUS W/ TWL LRG LVL3 (GOWN DISPOSABLE) ×6 IMPLANT
GOWN STRL REUS W/TWL LRG LVL3 (GOWN DISPOSABLE) ×6
NS IRRIG 1000ML POUR BTL (IV SOLUTION) ×4 IMPLANT
PACK C SECTION AR (MISCELLANEOUS) ×4 IMPLANT
PAD OB MATERNITY 4.3X12.25 (PERSONAL CARE ITEMS) ×4 IMPLANT
PAD PREP 24X41 OB/GYN DISP (PERSONAL CARE ITEMS) ×4 IMPLANT
PENCIL SMOKE ULTRAEVAC 22 CON (MISCELLANEOUS) ×4 IMPLANT
SPONGE LAP 18X18 RF (DISPOSABLE) ×16 IMPLANT
STAPLER INSORB 30 2030 C-SECTI (MISCELLANEOUS) ×4 IMPLANT
STRAP SAFETY 5IN WIDE (MISCELLANEOUS) ×4 IMPLANT
STRIP CLOSURE SKIN 1/2X4 (GAUZE/BANDAGES/DRESSINGS) ×3 IMPLANT
SUT MNCRL 4-0 (SUTURE) ×2
SUT MNCRL 4-0 27XMFL (SUTURE) ×2
SUT PDS AB 1 TP1 96 (SUTURE) ×4 IMPLANT
SUT VIC AB 0 CT1 36 (SUTURE) ×8 IMPLANT
SUT VIC AB 0 CTX 36 (SUTURE) ×2
SUT VIC AB 0 CTX36XBRD ANBCTRL (SUTURE) ×2 IMPLANT
SUT VIC AB 2-0 CT1 27 (SUTURE) ×2
SUT VIC AB 2-0 CT1 TAPERPNT 27 (SUTURE) ×2 IMPLANT
SUT VIC AB 2-0 SH 27 (SUTURE) ×2
SUT VIC AB 2-0 SH 27XBRD (SUTURE) ×2 IMPLANT
SUT VIC AB 3-0 SH 27 (SUTURE) ×2
SUT VIC AB 3-0 SH 27X BRD (SUTURE) ×2 IMPLANT
SUTURE MNCRL 4-0 27XMF (SUTURE) ×2 IMPLANT
SWABSTK COMLB BENZOIN TINCTURE (MISCELLANEOUS) ×4 IMPLANT

## 2019-05-10 NOTE — H&P (Addendum)
Preoperative History and Physical  Lindsey Mills is a 44 y.o. [id number] here for surgical management of term pregnancy and scheduled repeat cesarean at 37 weeks due to history of myomectomy with endometrial entry.   No significant preoperative concerns.  Has cerclage.  Proposed surgery: repeat cesarean delivery.  Pregnancy factors: 1. AMA (44) 2. History of myomectomy, LTCS.  3. Obesity (35 prepregnancy) 4. Hyperemesis throughout pregnancy 5. 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  Has cerclage now. 6. GBS positive   Prenatal Labs: Blood type/Rh O pos  Antibody screen neg  Rubella Immune  Varicella Immune  RPR NR  HBsAg Neg  HIV NR  GC neg  Chlamydia neg  Genetic screening Negative NIPT, neg AFP   1 hour GTT 95  3 hour GTT   GBS positive   Tdap: 03/10/19 Flu: declined COVID: negative 05/07/19    Past Medical History:  Diagnosis Date  . GERD (gastroesophageal reflux disease)    Past Surgical History:  Procedure Laterality Date  . CERVICAL CERCLAGE    . CERVICAL CERCLAGE N/A 12/04/2018   Procedure: CERCLAGE CERVICAL;  Surgeon: Melainie Krinsky, Honor Loh, MD;  Location: [facility] ORS;  Service: Gynecology;  Laterality: N/A;  . CESAREAN SECTION    . DILATION AND CURETTAGE OF UTERUS    . MYOMECTOMY     OB History  Gravida Para Term Preterm AB Living  4 1 1   2 1   SAB TAB Ectopic Multiple Live Births  1       1    # Outcome Date GA Lbr Len/2nd Weight Sex Delivery Anes PTL Lv  4 Current           3 Term 2015   2771 g F CS-LTranv   LIV  2 SAB 2013          1 AB 2000          Patient denies any other pertinent gynecologic issues.   No current facility-administered medications on file prior to encounter.   Current Outpatient Medications on File Prior to Encounter  Medication Sig Dispense Refill  . calcium carbonate (TUMS - DOSED IN MG ELEMENTAL CALCIUM) 500 MG chewable tablet Chew 1 tablet by mouth daily as needed for indigestion or heartburn.     . Prenatal Vit-Fe Fumarate-FA (MULTIVITAMIN-PRENATAL) 27-0.8 MG TABS tablet Take 1 tablet by mouth every evening.     . Doxylamine-Pyridoxine 10-10 MG TBEC Take 10 mg by mouth daily. (Patient not taking: Reported on 04/29/2019) 60 tablet 1  . scopolamine (TRANSDERM-SCOP, 1.5 MG,) 1 MG/3DAYS Place 1 patch (1.5 mg total) onto the skin every 3 (three) days. (Patient not taking: Reported on 04/29/2019) 10 patch 11   No Known Allergies  Social History:   reports that she has never smoked. She has never used smokeless tobacco. She reports that she does not drink alcohol or use drugs.  Family History  Problem Relation Age of Onset  . Colon cancer Father   . Breast cancer Neg Hx   . Ovarian cancer Neg Hx   . Diabetes Neg Hx   . Heart disease Neg Hx     Review of Systems: Noncontributory  PHYSICAL EXAM: Blood pressure 129/83, pulse 91, temperature 98.4 F (36.9 C), temperature source Oral, resp. rate 16, height 4\' 11"  (1.499 m), weight 82.6 kg, last menstrual period 08/23/2018. General appearance - alert, well appearing, and in no distress Chest - clear to auscultation, no wheezes, rales or rhonchi, symmetric air entry Heart - normal  rate and regular rhythm Abdomen - soft, nontender, nondistended, no masses or organomegaly Pelvic - examination not indicated Extremities - peripheral pulses normal, no pedal edema, no clubbing or cyanosis  Labs: Results for orders placed or performed during the hospital encounter of 05/07/19 (from the past 336 hour(s))  Basic metabolic panel   Collection Time: 05/07/19 12:41 PM  Result Value Ref Range   Sodium 136 135 - 145 mmol/L   Potassium 3.3 (L) 3.5 - 5.1 mmol/L   Chloride 106 98 - 111 mmol/L   CO2 22 22 - 32 mmol/L   Glucose, Bld 81 70 - 99 mg/dL   BUN 6 6 - 20 mg/dL   Creatinine, Ser 0.65 0.44 - 1.00 mg/dL   Calcium 8.5 (L) 8.9 - 10.3 mg/dL   GFR calc non Af Amer >60 >60 mL/min   GFR calc Af Amer >60 >60 mL/min   Anion gap 8 5 - 15  CBC    Collection Time: 05/07/19 12:41 PM  Result Value Ref Range   WBC 3.8 (L) 4.0 - 10.5 K/uL   RBC 4.52 3.87 - 5.11 MIL/uL   Hemoglobin 12.1 12.0 - 15.0 g/dL   HCT 38.0 36.0 - 46.0 %   MCV 84.1 80.0 - 100.0 fL   MCH 26.8 26.0 - 34.0 pg   MCHC 31.8 30.0 - 36.0 g/dL   RDW 14.6 11.5 - 15.5 %   Platelets 298 150 - 400 K/uL   nRBC 0.0 0.0 - 0.2 %  RPR   Collection Time: 05/07/19 12:41 PM  Result Value Ref Range   RPR Ser Ql NON REACTIVE NON REACTIVE  Type and screen [name]   Collection Time: 05/07/19 12:41 PM  Result Value Ref Range   ABO/RH(D) O POS    Antibody Screen NEG    Sample Expiration 05/10/2019,2359    Extend sample reason      PREGNANT WITHIN 3 MONTHS, UNABLE TO EXTEND Performed at [facility], [address] [place], [place] [address]   SARS CORONAVIRUS 2 (TAT 6-24 HRS) Nasopharyngeal Nasopharyngeal Swab   Collection Time: 05/07/19 12:50 PM   Specimen: Nasopharyngeal Swab  Result Value Ref Range   SARS Coronavirus 2 NEGATIVE NEGATIVE    Imaging Studies: No results found.  Assessment: Patient Active Problem List   Diagnosis Date Noted  . Labor and delivery indication for care or intervention 05/10/2019  . Irregular periods/menstrual cycles 05/06/2017  . Oligomenorrhea 05/06/2017  . Dysmenorrhea 04/25/2016  . Menorrhagia with regular cycle 04/25/2016  . Antepartum multigravida of advanced maternal age 45/09/2016  . History of cervical cerclage, currently pregnant 04/04/2016  . History of cesarean section 04/04/2016  . History of myomectomy 04/04/2016  . Uterine leiomyoma 04/04/2016  . Pelvic pain 04/04/2016  . Obesity (BMI 35.0-39.9 without comorbidity) 04/04/2016    Plan: Patient will undergo surgical management with cesarean delivery.   The risks of surgery were discussed in detail with the patient including but not limited to: bleeding which may require transfusion or reoperation; infection which may require antibiotics;  injury to surrounding organs which may involve bowel, bladder, ureters ; need for additional procedures including laparoscopy; thromboembolic phenomenon, surgical site problems and other postoperative/anesthesia complications. Likelihood of success in alleviating the patient's condition was discussed. Routine postoperative instructions will be reviewed with the patient and her family in detail after surgery.  The patient concurred with the proposed plan, giving informed written consent for the surgery.  Patient has been NPO since last night she  will remain NPO for procedure.  Anesthesia and OR aware.  Preoperative prophylactic antibiotics and SCDs ordered on call to the OR.  To OR when ready.  ----- Larey Days, MD, [place] Attending Obstetrician and Gynecologist [facility], Department of [facility]  05/10/2019 7:47 AM

## 2019-05-10 NOTE — Op Note (Addendum)
Cesarean Section Procedure Note  05/10/2019   Patient:  Lindsey Mills  44 y.o. female at [redacted]w[redacted]d.  Patient's last menstrual period was 08/23/2018 (exact date). Preoperative diagnosis:   Repeat Cesarean; prior myomectomy,  history of cervical insufficiency  Postoperative diagnosis:   Repeat Cesarean; prior myomectomy,  history of cervical insufficiency,  live born female,  adhesive disease.  PROCEDURE:  Procedure(s): CESAREAN SECTION (N/A) Lysis of adhesions, removal of cervical cerclage. Surgeon:  Surgeon(s) and Role:    * Constantino Starace, Honor Loh, MD - Primary Drinda Butts, CNM - assist Anesthesia:  spinal I/O: Total I/O In: 1100 [I.V.:1000; IV Piggyback:100] Out: [id number] [Urine:200; Blood:829] Specimens:  Cord Blood Complications: None Apparent Disposition:  VS stable to PACU  Findings:  Anterior uterus adherent to anterior peritoneum in LUS and mid uterus.  Fundus clear.   Some posterior adhesions, not visualized Thin anterior LUS Bladder adherent to mid-uterus and right anteriorlateral peritoneum Enlarged uterus, small serosal fibroids, likely more intramural fibroids Normal bilateral tubes and right ovary (left ovary not attempted to be seen) Live born female "[name]" Birth Weight: 5 lb 11 oz (2580 g) APGAR: 9, 9  Newborn Delivery   Birth date/time: 05/10/2019 08:56:00 Delivery type: C-Section, Low Transverse Trial of labor: No C-section categorization: Repeat      Indication for procedure: 44 y.o. female at [redacted]w[redacted]d with current pregnancy with cervical cerclage and history of myomectomy and prior cesarean.  She was scheduled for today's surgery.    Procedure Details   The risks, benefits, complications, treatment options, and expected outcomes were discussed with the patient. Informed consent was obtained. The patient was taken to Operating Room, identified as Qadriyyah Bergsma and the procedure verified as a cesarean delivery and removal of cervical cerclage.    After administration of anesthesia, the patient was prepped and draped in the usual sterile manner, including a vaginal prep. A surgical time out was performed, with the pediatric team present. After confirming adequate anesthesia, a Pfannenstiel incision was made and carried down through the subcutaneous tissue to the fascia. Fascial incision was made and extended transversely. The fascia was separated from the underlying rectus tissue superiorly and inferiorly. The rectus muscles were divided in the midline. The peritoneum was identified and entered. The serosa of the uterus was adherent to the site of entry and surrounding areas.  With careful dissection using metzenbaum scissors, a small window was made between the uterus and the peritoneum.  A finger was guided to areas that were separate and this provided traction to ligate the scar tissue that had formed between the two layers.  The peritoneum was sacrified in areas to prevent deserolization of the uterus. Peritoneal incision was extended inferiorly and laterally using both cautery and scissors.  The bladder was tethered to part of the serosal adhesions to the peritoneum and these were dissected away without damage to the bladder. A bladder blade was placed.   A low transverse uterine incision was made. Delivered from cephalic presentation was a live born female. Delayed cord clamping was performed for 60 seconds during which time we sang happy birthday to baby [name]. The umbilical cord was doubly clamped and cut, and the baby was handed off to the awaitng pediatrician.  Cord blood was obtained for evaluation. The placenta was removed intact and appeared normal. The uterus was left inside abdominal cavity due to size and posterior adhesions.  It was cleared of clots, membranes, and debris. The uterus, tubes and ovaries appeared normal. The uterine incision was closed with  running locking sutures of 0 Vicryl, and then a second, imbricating stitch was  placed. Two figures of eights were placed midline and hemostasis was observed. The interrupted serosal surface had areas of bleeding, and these were both cauterized and ligated with 2-0 suture in figures of eight.   The abdominal cavity was evacuated of extraneous fluid. The uterus was returned to the abdominal cavity and again the incision was inspected for hemostasis, which was confirmed.  The paracolic gutters were cleaned.  The fascia was then reapproximated with running suture of vicryl. 90cc of Long- and short-acting bupivicaine was injected circumferentially into the fascia.  After a change of gloves, the subcutaneous tissue was irrigated and reapproximated with 3-0 vicryl. The skin was closed with absorbable staples and 10cc of long- and short-acting bupivacaine injected into the skin and subcutaneous tissues.  The incision was covered with surgical glue.   The attention was turned to the vagina and cervix.  A ringed forceps was placed into the vagina and grasped the end of the cerclage suture.  This was retracted and scissors cut the tape at the cervix, and the stitch was removed.  The sites were hemostatic.    An abdominal binder was placed.  Instrument, sponge, and needle counts were correct prior the abdominal closure and at the conclusion of the case.   I was present and performed this procedure in its entirety.  VTE: SCDs Perioperative antibiotics: Ancef 2g  ----- Larey Days, MD Attending Obstetrician and Gynecologist [facility], Department of [facility]

## 2019-05-10 NOTE — Anesthesia Procedure Notes (Signed)
Spinal  Patient location during procedure: OR Start time: 05/10/2019 8:08 AM End time: 05/10/2019 8:13 AM Staffing Performed: resident/CRNA  Resident/CRNA: Justus Memory, CRNA Preanesthetic Checklist Completed: patient identified, IV checked, site marked, risks and benefits discussed, surgical consent, monitors and equipment checked, pre-op evaluation and timeout performed Spinal Block Patient position: sitting Prep: Betadine Patient monitoring: heart rate, continuous pulse ox, blood pressure and cardiac monitor Approach: midline Location: L2-3 Injection technique: single-shot Needle Needle type: Introducer and Pencan  Needle gauge: 24 G Needle length: 9 cm Additional Notes Negative paresthesia. Negative blood return. Positive free-flowing CSF. Expiration date of kit checked and confirmed. Patient tolerated procedure well, without complications.

## 2019-05-10 NOTE — Anesthesia Preprocedure Evaluation (Addendum)
Anesthesia Evaluation  Patient identified by MRN, date of birth, ID band Patient awake    Reviewed: Allergy & Precautions, NPO status , Patient's Chart, lab work & pertinent test results  Airway Mallampati: III       Dental  (+) Teeth Intact   Pulmonary neg pulmonary ROS,    Pulmonary exam normal        Cardiovascular negative cardio ROS Normal cardiovascular exam     Neuro/Psych negative neurological ROS  negative psych ROS   GI/Hepatic Neg liver ROS, GERD  ,  Endo/Other  negative endocrine ROS  Renal/GU negative Renal ROS  negative genitourinary   Musculoskeletal negative musculoskeletal ROS (+)   Abdominal Normal abdominal exam  (+)   Peds negative pediatric ROS (+)  Hematology negative hematology ROS (+)   Anesthesia Other Findings   Reproductive/Obstetrics (+) Pregnancy                             Anesthesia Physical Anesthesia Plan  ASA: II  Anesthesia Plan: Spinal   Post-op Pain Management:    Induction: Intravenous  PONV Risk Score and Plan:   Airway Management Planned: Nasal Cannula  Additional Equipment:   Intra-op Plan:   Post-operative Plan:   Informed Consent: I have reviewed the patients History and Physical, chart, labs and discussed the procedure including the risks, benefits and alternatives for the proposed anesthesia with the patient or authorized representative who has indicated his/her understanding and acceptance.     Dental advisory given  Plan Discussed with: CRNA and Surgeon  Anesthesia Plan Comments:         Anesthesia Quick Evaluation

## 2019-05-10 NOTE — Lactation Note (Signed)
This note was copied from a baby's chart. Lactation Consultation Note  Patient Name: Lindsey Mills [id number] Date: 05/10/2019 Reason for consult: Initial assessment;Mother's request;Early term 37-38.6wks;Infant < 6lbs;Other (Comment)(Mom has large elongated nipples with small areola tissue)  Mom has large, elongated nipples with not a lot of areola or breast tissue.  Demonstrated how to easily hand express colostrum. Mom's nipples were a little tender initially. Lindsey Mills is eager and latches after a few attempts.  He begins strong rhythmic sucking but starts to fall asleep a few minutes into the feeding.  He pushed the nipple out of his mouth after about 5 minutes and appeared satiated.  Within a minute he started squirming and sucking on his hand and then his thumb.  Explained feeding cues to parents and mom willing to put him to the second breast.  Positioned Lindsey Mills in football hold with pillow support on left breast where he begins sucking that mom can feel as strong tugs at the breast and swallows were heard.  When he pauses, demonstrated how to massage breast and gently touch chin to keep him actively sucking at the breast for 20 minutes before coming off and burping.  Mom needed me to stay with her during feeding because she kept falling asleep. Mom breast fed first baby, who is now 82 years old, for 6 months.  She felt like when she started pumping that she could not keep up with her demands.  Reviewed normal newborn stomach size, supply and demand, normal course of lactation and routine newborn feeding patterns.  Mom requested for lactation to come back and help with future feedings.  Told mom when she went to her room on mother/baby unit that we would write lactation's name and number on the white board and encouraged her to call with any questions, concerns or assistance.  Maternal Data Formula Feeding for Exclusion: No Has patient been taught Hand Expression?: Yes Does the patient have  breastfeeding experience prior to this delivery?: Yes  Feeding Feeding Type: Breast Fed  LATCH Score Latch: Repeated attempts needed to sustain latch, nipple held in mouth throughout feeding, stimulation needed to elicit sucking reflex.  Audible Swallowing: A few with stimulation  Type of Nipple: Everted at rest and after stimulation(Large elongated nipple)  Comfort (Breast/Nipple): Filling, red/small blisters or bruises, mild/mod discomfort(Tender when hand expressed)  Hold (Positioning): Assistance needed to correctly position infant at breast and maintain latch.  LATCH Score: 6  Interventions Interventions: Breast feeding basics reviewed;Assisted with latch;Skin to skin;Breast massage;Hand express;Breast compression;Adjust position;Support pillows;Position options  Lactation Tools Discussed/Used WIC Program: [employer])   Consult Status Consult Status: Follow-up Date: 05/10/19 Follow-up type: In-patient    Lindsey Mills 05/10/2019, 12:03 PM

## 2019-05-10 NOTE — Transfer of Care (Signed)
Immediate Anesthesia Transfer of Care Note  Patient: Lindsey Mills  Procedure(s) Performed: CESAREAN SECTION (N/A )  Patient Location: PACU  Anesthesia Type:Spinal  Level of Consciousness: sedated  Airway & Oxygen Therapy: Patient Spontanous Breathing and Patient connected to nasal cannula oxygen  Post-op Assessment: VSS  Post vital signs: Reviewed and stable  Last Vitals:  Vitals Value Taken Time  BP 111/84 05/10/19 1042  Temp 36.2 C 05/10/19 1042  Pulse    Resp 20 05/10/19 1042  SpO2 100 % 05/10/19 1042    Last Pain:  Vitals:   05/10/19 1042  TempSrc: Axillary  PainSc:          Complications: No apparent anesthesia complications

## 2019-05-11 LAB — CBC
HCT: 29.4 % — ABNORMAL LOW (ref 36.0–46.0)
Hemoglobin: 9.5 g/dL — ABNORMAL LOW (ref 12.0–15.0)
MCH: 27.1 pg (ref 26.0–34.0)
MCHC: 32.3 g/dL (ref 30.0–36.0)
MCV: 84 fL (ref 80.0–100.0)
Platelets: 246 10*3/uL (ref 150–400)
RBC: 3.5 MIL/uL — ABNORMAL LOW (ref 3.87–5.11)
RDW: 14.6 % (ref 11.5–15.5)
WBC: 7.7 10*3/uL (ref 4.0–10.5)
nRBC: 0 % (ref 0.0–0.2)

## 2019-05-11 MED ORDER — FERROUS SULFATE 325 (65 FE) MG PO TABS
325.0000 mg | ORAL_TABLET | Freq: Every day | ORAL | Status: DC
  Filled 2019-05-11: qty 1

## 2019-05-11 MED ORDER — IBUPROFEN 800 MG PO TABS
800.0000 mg | ORAL_TABLET | Freq: Four times a day (QID) | ORAL | Status: DC
  Filled 2019-05-11 (×2): qty 1

## 2019-05-11 NOTE — Discharge Instructions (Signed)
Discharge Instructions:  ° °Follow-up Appointment:  ° °If there are any new medications, they have been ordered and will be available for pickup at the listed pharmacy on your way home from the hospital.  ° °Call office if you have any of the following: headache, visual changes, fever >101.0 F, chills, shortness of breath, breast concerns, excessive vaginal bleeding, incision drainage or problems, leg pain or redness, depression or any other concerns. If you have vaginal discharge with an odor, let your doctor know.  ° °It is normal to bleed for up to 6 weeks. You should not soak through more than 1 pad in 1 hour. If you have a blood clot larger than your fist with continued bleeding, call your doctor.  ° °After a c-section, you should expect a small amount of blood or clear fluid coming from the incision and abdominal cramping/soreness. Inspect your incision site daily. Stand in front of a mirror to look for any redness, incision opening, or discolored/odorness drainage. Take a shower daily and continue good hygiene. Use own towel and washcloth (do not share). Make sure your sheets on your bed are clean. No pets sleeping around your incision site. Dressing will be removed at your postpartum visit. If the dressing does become wet or soiled underneath, it is okay to remove it.  ° °Activity: Do not lift > 10 lbs for 6 weeks (do not lift anything heavier than your baby). °No intercourse, tampons, swimming pools, hot tubs, baths (only showers) for 6 weeks.  °No driving for 1-2 weeks. °Continue prenatal vitamin, especially if breastfeeding. °Increase calories and fluids (water) while breastfeeding.  ° °Your milk will come in, in the next couple of days (right now it is colostrum). You may have a slight fever when your milk comes in, but it should go away on its own.  If it does not, and rises above 101 F please call the doctor. You will also feel achy and your breasts will be firm. They will also start to leak. If you  are breastfeeding, continue as you have been and you can pump/express milk for comfort.  ° °If you have too much milk, your breasts can become engorged, which could lead to mastitis. This is an infection of the milk ducts. It can be very painful and you will need to notify your doctor to obtain a prescription for antibiotics. You can also treat it with a shower or hot/cold compress.  ° °For concerns about your baby, please call your pediatrician.  °For breastfeeding concerns, the lactation consultant can be reached at [phone].  ° °Postpartum blues (feelings of happy one minute and sad another minute) are normal for the first few weeks but if it gets worse let your doctor know.  ° °Congratulations! We enjoyed caring for you and your new bundle of joy!  °

## 2019-05-11 NOTE — Lactation Note (Signed)
This note was copied from a baby's chart. Lactation Consultation Note  [place] student entered room for follow-up assessment. Mother of baby present, awake. Infant asleep in bassinet.   Mother of baby is [id number], with a 43 year old at home. Mother of baby reports 6 months of breastfeeding experience. Feeding choice at admission and now is breastmilk. Mother of baby has everted nipples with stimulation at at rest. Infant is currently 71 hours old, currently experiencing 0% weight loss.   [facility] student observed mother of baby having large, elongated nipples. [facility] student taught hand expression, colostrum easily expressed and reiterated breastfeeding basics. Skin to skin encouraged. [facility] student reviewed expected output and the importance of stimulation, feeding 8-12x/24hr period. [facility] student went over output expectations and color.  Mother of baby encouraged to continue skin to skin. [facility] student encouraged mother of baby to feed infant on demand and continue to monitor infant's output. [facility] student encouraged mother of baby to call out to in patient lactation at the next feed, to observe a feeding. Mother of baby agreed. In patient lactation information given. [place] student to follow up.   Patient Name: Lindsey Mills Date: 05/11/2019 Reason for consult: Follow-up assessment   Maternal Data Formula Feeding for Exclusion: No Has patient been taught Hand Expression?: Yes Does the patient have breastfeeding experience prior to this delivery?: Yes  Feeding    LATCH Score                   Interventions Interventions: Breast feeding basics reviewed;Hand express  Lactation Tools Discussed/Used Tools: Coconut oil   Consult Status Consult Status: Follow-up Date: 05/11/19 Follow-up type: In-patient    Lindsey Mills 05/11/2019, 10:32 AM

## 2019-05-11 NOTE — Progress Notes (Signed)
Post Op Day 1  Subjective: Doing well, no concerns. Ambulating without difficulty, pain managed with PO meds, tolerating regular diet, and voiding without difficulty.   No fever/chills, chest pain, shortness of breath, nausea/vomiting, or leg pain. No nipple or breast pain.  Objective: BP 123/83 (BP Location: Right Arm)   Pulse 78   Temp 98.4 F (36.9 C) (Oral)   Resp 18   Ht 4\' 11"  (1.499 m)   Wt 82.6 kg   LMP 08/23/2018 (Exact Date)   SpO2 99% Comment: Room Air  Breastfeeding Unknown   BMI 36.76 kg/m    Physical Exam:  General: alert, cooperative and appears stated age Breasts: soft/nontender CV: RRR Pulm: nl effort, CTABL Abdomen: soft, non-tender, active bowel sounds Uterine Fundus: firm Incision: healing well, no significant drainage, no dehiscence, no significant erythema Lochia: appropriate DVT Evaluation: No evidence of DVT seen on physical exam. No cords or calf tenderness. No significant calf/ankle edema.  Recent Labs    05/11/19 0617  HGB 9.5*  HCT 29.4*  WBC 7.7  PLT 246    Assessment/Plan: 45 y.o. [id number] postop day # 1  -Continue routine postpartum care -Lactation consult PRN for breastfeeding  -Acute blood loss anemia - hemodynamically stable and asymptomatic; start PO ferrous sulfate daily with stool softeners  -Immunization status: all immunizations up to date  Disposition: Continue inpatient postpartum care   LOS: 1 day   Lisette Grinder, CNM 05/11/2019, 9:55 AM   ----- Lisette Grinder Certified Nurse Midwife [facility]

## 2019-05-11 NOTE — Lactation Note (Signed)
This note was copied from a baby's chart. Lactation Consultation Note  [employer] student entered room for follow up assessment. Mother of baby present. Infant in bassinet, phototherapy.   Mother of baby reports that she has continued with attempts to breastfeed and pump. No concerns at this time.   [facility] student went over the plan for feeding infant over night.   [facility] student encouraged mother of baby to breastfeed on demand, follow infant's hunger cues. Mother of baby encouraged to use DEBP after offering the breast and give infant expressed breast milk. Mother of baby encouraged to reach out to in patient lactation for continued support. Lactation to follow up on 03/17   Patient Name: Lindsey Mills [id number] Date: 05/11/2019 Reason for consult: Follow-up assessment   Maternal Data Formula Feeding for Exclusion: No Has patient been taught Hand Expression?: Yes Does the patient have breastfeeding experience prior to this delivery?: Yes  Feeding Feeding Type: Breast Milk  LATCH Score Latch: Repeated attempts needed to sustain latch, nipple held in mouth throughout feeding, stimulation needed to elicit sucking reflex.  Audible Swallowing: A few with stimulation  Type of Nipple: Everted at rest and after stimulation  Comfort (Breast/Nipple): Soft / non-tender  Hold (Positioning): Assistance needed to correctly position infant at breast and maintain latch.  LATCH Score: 7  Interventions Interventions: Breast feeding basics reviewed;Expressed milk;DEBP  Lactation Tools Discussed/Used Tools: Pump;6F feeding tube / Syringe(Syringe/Finger fed) Breast pump type: Double-Electric Breast Pump Pump Review: Setup, frequency, and cleaning Initiated by:: Lindsey Mills Lactation Student Date initiated:: 05/11/19   Consult Status Consult Status: Follow-up Date: 05/11/19 Follow-up type: In-patient    Lindsey Mills 05/11/2019, 4:57 PM

## 2019-05-11 NOTE — Anesthesia Postprocedure Evaluation (Signed)
Anesthesia Post Note  Patient: [name]  Procedure(s) Performed: CESAREAN SECTION (N/A ) CERCLAGE CERVICAL REMOVAL (Cervix)  Patient location during evaluation: Mother Baby Anesthesia Type: Spinal Level of consciousness: oriented and awake and alert Pain management: pain level controlled Vital Signs Assessment: post-procedure vital signs reviewed and stable Respiratory status: spontaneous breathing and respiratory function stable Cardiovascular status: blood pressure returned to baseline and stable Postop Assessment: no headache, no backache, no apparent nausea or vomiting and able to ambulate Anesthetic complications: no     Last Vitals:  Vitals:   05/11/19 0500 05/11/19 0748  BP:  123/83  Pulse: 83 78  Resp:  18  Temp:  36.9 C  SpO2: 99% 99%    Last Pain:  Vitals:   05/11/19 0748  TempSrc: Oral  PainSc:                  Lindsey Mills

## 2019-05-11 NOTE — Lactation Note (Signed)
This note was copied from a baby's chart. Lactation Consultation Note  [place] student entered room for follow-up assessment. Mother of baby and support person present. Infant actively nursing.   Mother of baby reports that she would like to start pumping with the DEBP, per suggested by [facility] student. Feeding choice on admission and now is breastmilk. Mother of baby has everted nipples. Infant is currently at 28 hours of life.   [facility] student observed mother of baby finish nursing infant on left breast in cradle position. Mother of baby unlatched infant and placed infant back in bassinet, to continue phototherapy. [place] student set up DEBP and explained pump parts of the kit, as well as proper cleaning. [facility] student set mother of baby up with a size 81m flange size, mother of baby reports that it is comfortable. [place] scraped inside of flange with a Q-tip, then swabbed infant's mouth with expressed colostrum. [facility] explained the benefits of swabbing infant's mouth. Mother of baby was able to express drops of colostrum with DEBP and [place] gave the expressed colostrum via 21F syringe/finger feed.   [facility] encouraged mother of baby to continue bringing baby to breast on demand, and following baby's feeding cues. Breastfeeding basics reviewed, mother of baby encouraged to continue stimulating breasts at least 8-12 times in 24hours, offer breast first, then follow up with DEBP to promote milk production and to increase infant stooling. [facility] encouraged mother of baby to call out to in-patient lactation if needed.   Patient Name: Lindsey TVivienne SangiovanniTOBTVM'TDate: 05/11/2019 Reason for consult: Follow-up assessment   Maternal Data Formula Feeding for Exclusion: No Has patient been taught Hand Expression?: Yes Does the patient have breastfeeding experience prior to this delivery?: Yes  Feeding Feeding Type: Breast Milk  LATCH Score Latch: Repeated attempts needed to sustain latch, nipple  held in mouth throughout feeding, stimulation needed to elicit sucking reflex.  Audible Swallowing: A few with stimulation  Type of Nipple: Everted at rest and after stimulation  Comfort (Breast/Nipple): Soft / non-tender  Hold (Positioning): Assistance needed to correctly position infant at breast and maintain latch.  LATCH Score: 7  Interventions Interventions: Breast feeding basics reviewed;Expressed milk;DEBP  Lactation Tools Discussed/Used Tools: Pump;21F feeding tube / Syringe(Syringe/Finger fed) Breast pump type: Double-Electric Breast Pump Pump Review: Setup, frequency, and cleaning Initiated by:: Kimoni Pickerill D. JRonnald RampLactation Student Date initiated:: 05/11/19   Consult Status Consult Status: Follow-up Date: 05/11/19 Follow-up type: In-patient    ISaddie Benders3/16/2021, 2:42 PM

## 2019-05-11 NOTE — Anesthesia Post-op Follow-up Note (Signed)
  Anesthesia Pain Follow-up Note  Patient: Lindsey Mills  Day #: 1  Date of Follow-up: 05/11/2019 Time: 8:33 AM  Last Vitals:  Vitals:   05/11/19 0500 05/11/19 0748  BP:  123/83  Pulse: 83 78  Resp:  18  Temp:  36.9 C  SpO2: 99% 99%    Level of Consciousness: alert  Pain: mild   Side Effects:None  Catheter Site Exam:clean, dry     Plan: D/C from anesthesia care at surgeon's request  Estill Batten

## 2019-05-12 DIAGNOSIS — Z98891 History of uterine scar from previous surgery: Secondary | ICD-10-CM

## 2019-05-12 MED ORDER — DOCUSATE SODIUM 100 MG PO CAPS: 100.0000 mg | ORAL_CAPSULE | Freq: Every day | ORAL | Status: AC | PRN

## 2019-05-12 MED ORDER — OXYCODONE HCL 5 MG PO TABS: 5.0000 mg | ORAL_TABLET | ORAL | 0 refills | Status: DC | PRN

## 2019-05-12 MED ORDER — IBUPROFEN 800 MG PO TABS: 800.0000 mg | ORAL_TABLET | Freq: Three times a day (TID) | ORAL | 0 refills | Status: DC | PRN

## 2019-05-12 MED ORDER — FERROUS SULFATE 325 (65 FE) MG PO TABS: 325.0000 mg | ORAL_TABLET | Freq: Every day | ORAL | Status: DC

## 2019-05-12 MED ORDER — SIMETHICONE 80 MG PO CHEW: 80.0000 mg | CHEWABLE_TABLET | Freq: Four times a day (QID) | ORAL | Status: DC | PRN

## 2019-05-12 NOTE — Lactation Note (Signed)
This note was copied from a baby's chart. Lactation Consultation Note  Patient Name: Boy Aayat Bockover [id number] Date: 05/12/2019 Reason for consult: Follow-up assessment  [facility] student entered to Mom soothing baby at the bassinet. Mom swaddled baby and stood swaying with baby for the rest of the consult.   Mom asked about uterine contractions during breastfeeding, and [place] student explained the process and significance thereof. [facility] student educated Mom on output expectations, documenting pees and poos once home, and poop color changes. Mom was educated on cluster feeding expectations, breast care and the availability of outpatient lactation support. [facility] student applauded Mom for doing such a brilliant job so far and encouraged her to call for any help she needs while still in the hospital and once home.   Consult Status Consult Status: Complete Date: 05/12/19 Follow-up type: Call as needed    Kerrie Pleasure 05/12/2019, 10:52 AM

## 2019-05-12 NOTE — Progress Notes (Signed)
Pt discharged with infant. Discharge instructions, prescriptions, and follow up appointments given to and reviewed with patient. Pt verbalized understanding. To be escorted out by auxillary.  °

## 2019-05-12 NOTE — Discharge Summary (Signed)
Obstetrical Discharge Summary  Patient Name: Lindsey Mills DOB: 02-17-1976 MRN: [id number]  Date of Admission: 05/10/2019 Date of Delivery: 05/10/19 Delivered by: Dr. Leonides Schanz Date of Discharge: 05/12/2019  Primary OB: [facility] [id number] last menstrual period was 08/23/2018 (exact date). EDC Estimated Date of Delivery: 05/30/19 Gestational Age at Delivery: [redacted]w[redacted]d   Antepartum complications:  Pregnancy factors: 1. AMA (44) 2. History of myomectomy, LTCS.  3. Obesity (35 prepregnancy) 4. Hyperemesis throughout pregnancy 5. 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  Has cerclage now. 6. GBS positive Admitting Diagnosis: Scheduled Repeat c/s with cerclage Secondary Diagnosis: Patient Active Problem List   Diagnosis Date Noted  . S/P cesarean section 05/12/2019  . Irregular periods/menstrual cycles 05/06/2017  . Oligomenorrhea 05/06/2017  . Dysmenorrhea 04/25/2016  . Menorrhagia with regular cycle 04/25/2016  . Antepartum multigravida of advanced maternal age 92/09/2016  . History of cervical cerclage, currently pregnant 04/04/2016  . History of cesarean section 04/04/2016  . History of myomectomy 04/04/2016  . Uterine leiomyoma 04/04/2016  . Pelvic pain 04/04/2016  . Obesity (BMI 35.0-39.9 without comorbidity) 04/04/2016    Augmentation: n/a Complications: None  Intrapartum complications/course:  Delivery Type: repeat cesarean section, low transverse incision Anesthesia: Spinal Placenta: spontaneous Laceration: n/a Episiotomy: none Newborn Data: Live born female  Birth Weight: 5 lb 11 oz (2580 g) APGAR: 9, 9  Newborn Delivery   Birth date/time: 05/10/2019 08:56:00 Delivery type: C-Section, Low Transverse Trial of labor: No C-section categorization: Repeat      Postpartum Procedures: none  Patient had an uncomplicated postpartum course.  By time of discharge on POD#2, her pain was controlled without oral pain medication -  pt refused; she had appropriate lochia and was ambulating, voiding without difficulty, tolerating regular diet and passing flatus.   She was deemed stable for discharge to home.    Discharge Physical Exam:  BP (!) 142/93 (BP Location: Left Arm)   Pulse 87   Temp 98.6 F (37 C) (Oral)   Resp 20   Ht 4\' 11"  (1.499 m)   Wt 82.6 kg   LMP 08/23/2018 (Exact Date)   SpO2 98%   Breastfeeding Unknown   BMI 36.76 kg/m   General: alert and no distress Pulm: normal respiratory effort Lochia: appropriate Abdomen: soft, NT Uterine Fundus: firm, below umbilicus Incision: c/d/i, healing well, no significant drainage, no dehiscence, no significant erythema Extremities: No evidence of DVT seen on physical exam. No lower extremity edema. [place]:   Labs: CBC Latest Ref Rng & Units 05/11/2019 05/07/2019 11/03/2018  WBC 4.0 - 10.5 K/uL 7.7 3.8(L) 5.2  Hemoglobin 12.0 - 15.0 g/dL 9.5(L) 12.1 12.7  Hematocrit 36.0 - 46.0 % 29.4(L) 38.0 39.1  Platelets 150 - 400 K/uL 246 298 355   O POS Hemoglobin  Date Value Ref Range Status  05/11/2019 9.5 (L) 12.0 - 15.0 g/dL Final  11/03/2018 12.7 11.1 - 15.9 g/dL Final   HCT  Date Value Ref Range Status  05/11/2019 29.4 (L) 36.0 - 46.0 % Final   Hematocrit  Date Value Ref Range Status  11/03/2018 39.1 34.0 - 46.6 % Final    Disposition: stable, discharge to home Baby Feeding: breastmilk Baby Disposition: home with mom  Contraception: TBD  Prenatal Labs:  Blood type/Rh O pos  Antibody screen neg  Rubella Immune  Varicella Immune  RPR NR  HBsAg Neg  HIV NR  GC neg  Chlamydia neg  Genetic screening negative  1 hour GTT 95  3 hour GTT  GBS pos   Rh Immune globulin given: n/a Rubella vaccine given: n/a Varicella vaccine given: n/a Tdap vaccine given in AP or PP setting: AP on 03/10/19 Flu vaccine given in AP or PP setting: AP declined 02/09/19  Plan: Aidy Wray-Hamilton was discharged to home in good condition. Follow-up appointment  with delivering provider in 6 weeks.  Discharge Instructions: Per After Visit Summary. Activity: Advance as tolerated. Pelvic rest for 6 weeks.   Diet: Regular Discharge Medications: Allergies as of 05/12/2019   No Known Allergies     Medication List    STOP taking these medications   Doxylamine-Pyridoxine 10-10 MG Tbec   scopolamine 1 MG/3DAYS Commonly known as: Transderm-Scop (1.5 MG)     TAKE these medications   calcium carbonate 500 MG chewable tablet Commonly known as: TUMS - dosed in mg elemental calcium Chew 1 tablet by mouth daily as needed for indigestion or heartburn.   docusate sodium 100 MG capsule Commonly known as: Colace Take 1 capsule (100 mg total) by mouth daily as needed.   ferrous sulfate 325 (65 FE) MG tablet Take 1 tablet (325 mg total) by mouth daily with breakfast. Start taking on: May 13, 2019   ibuprofen 800 MG tablet Commonly known as: ADVIL Take 1 tablet (800 mg total) by mouth every 8 (eight) hours as needed for mild pain.   multivitamin-prenatal 27-0.8 MG Tabs tablet Take 1 tablet by mouth every evening.   oxyCODONE 5 MG immediate release tablet Commonly known as: Oxy IR/ROXICODONE Take 1 tablet (5 mg total) by mouth every 4 (four) hours as needed for moderate pain.   simethicone 80 MG chewable tablet Commonly known as: MYLICON Chew 1 tablet (80 mg total) by mouth 4 (four) times daily as needed for flatulence.      Outpatient follow up:  Follow-up Information    Ward, Honor Loh, MD. Go in 2 week(s).   Specialty: Obstetrics and Gynecology Why: Patient reports she already has a post-op appt scheduled Contact information: [place] [address] [phone]        [facility] Follow up in 2 day(s).   Why: For a BP check Contact information: [place] [id number]          Signed: Regina Eck 05/12/2019 11:24 AM

## 2020-02-03 ENCOUNTER — Other Ambulatory Visit: Payer: Self-pay | Admitting: Obstetrics & Gynecology

## 2020-02-03 DIAGNOSIS — Z1231 Encounter for screening mammogram for malignant neoplasm of breast: Secondary | ICD-10-CM

## 2020-05-17 IMAGING — US US OB TRANSVAGINAL
1 series · 13 of 28 positions shown · non-contrast
Comparison: Obstetric ultrasound 02/04/2018 at an outside
institution

CLINICAL DATA: Pregnant patient in first-trimester pregnancy with
vaginal bleeding.

EXAM:
OBSTETRIC <14 WK US AND TRANSVAGINAL OB US
TECHNIQUE: Both transabdominal and transvaginal ultrasound examinations were
performed for complete evaluation of the gestation as well as the
maternal uterus, adnexal regions, and pelvic cul-de-sac.
Transvaginal technique was performed to assess early pregnancy.

[Series 1: us ob transvaginal · 0.19mm/px · 13 of 74 slices shown]
[im 3/74]
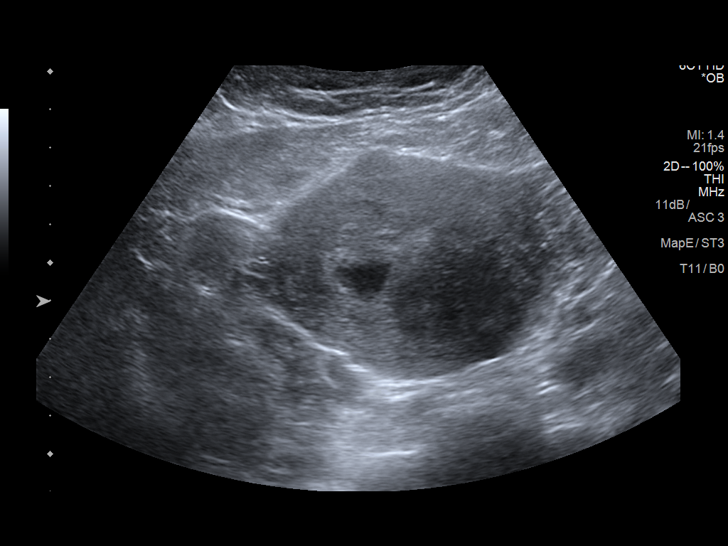
[im 9/74]
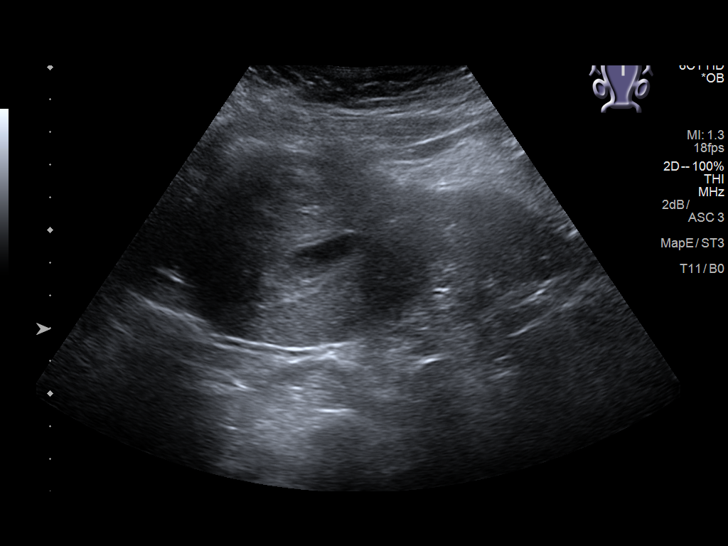
[im 14/74]
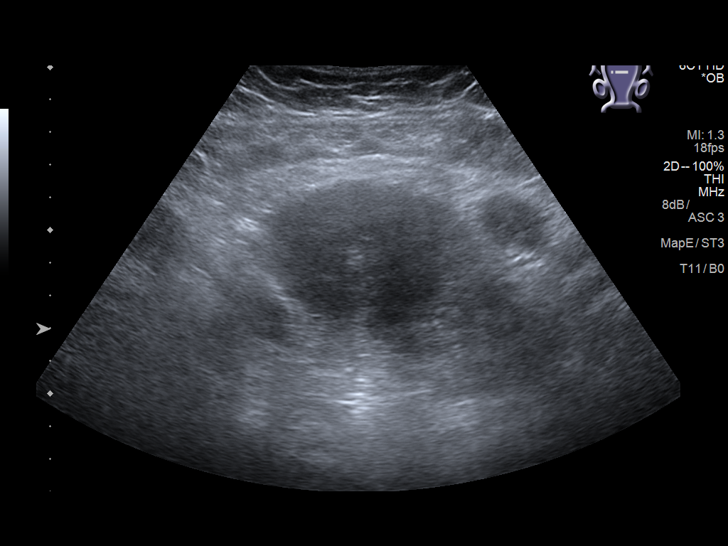
[im 19/74]
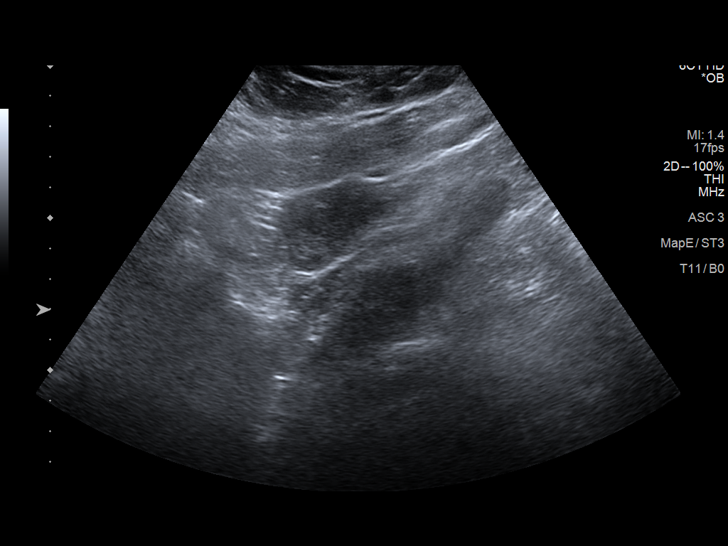
[im 25/74]
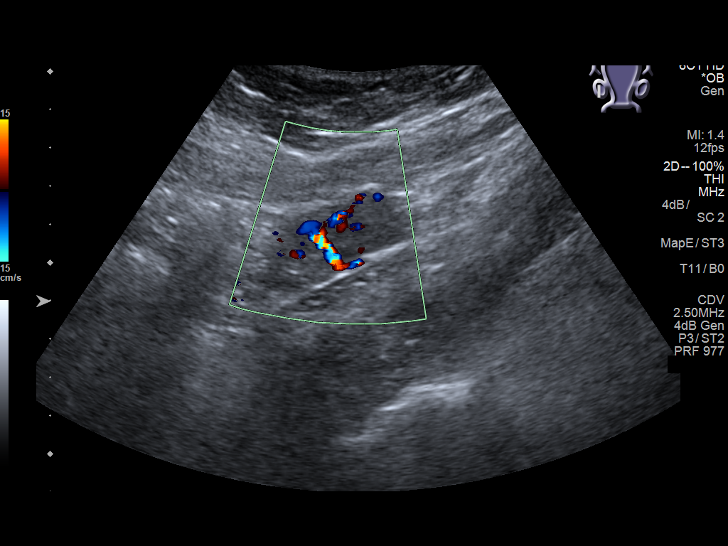
[im 30/74]
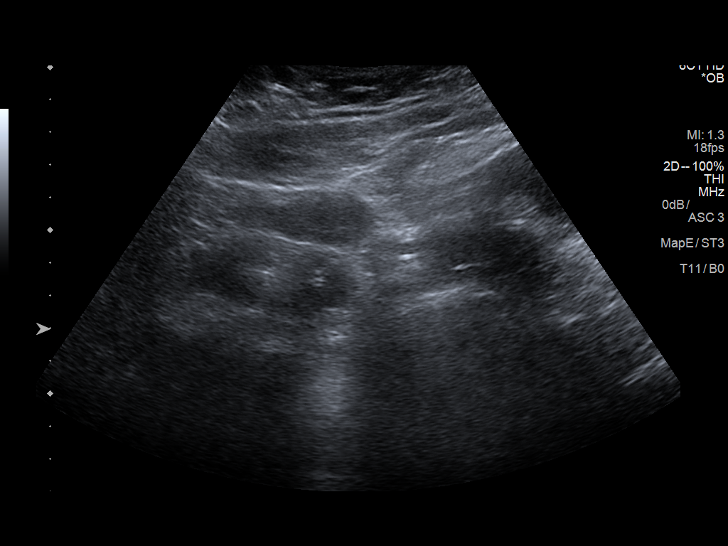
[im 38/74]
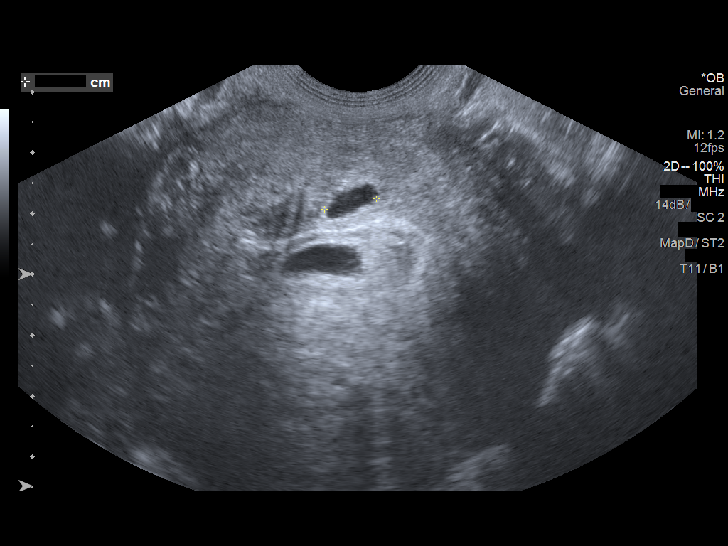
[im 44/74]
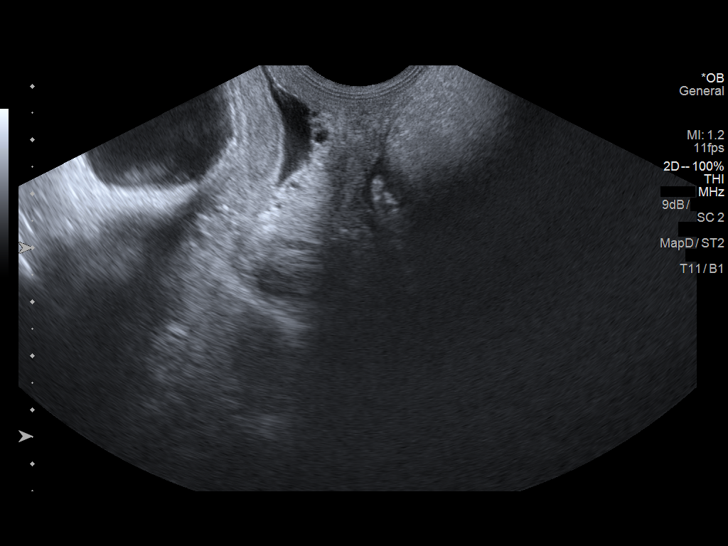
[im 49/74]
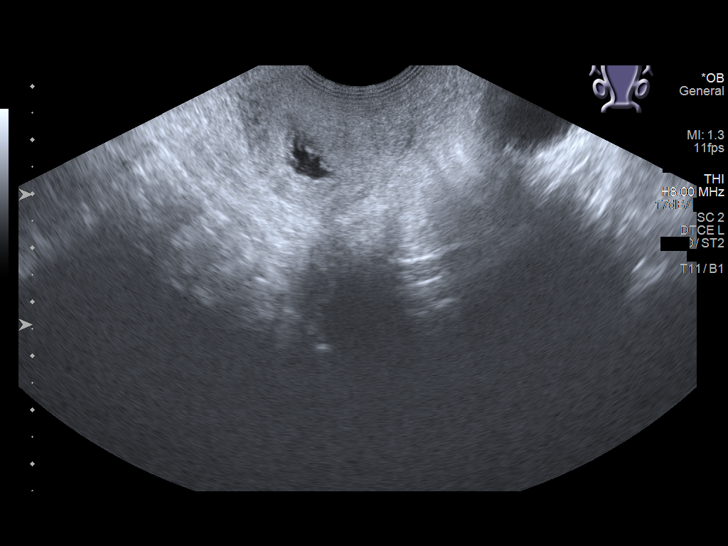
[im 55/74]
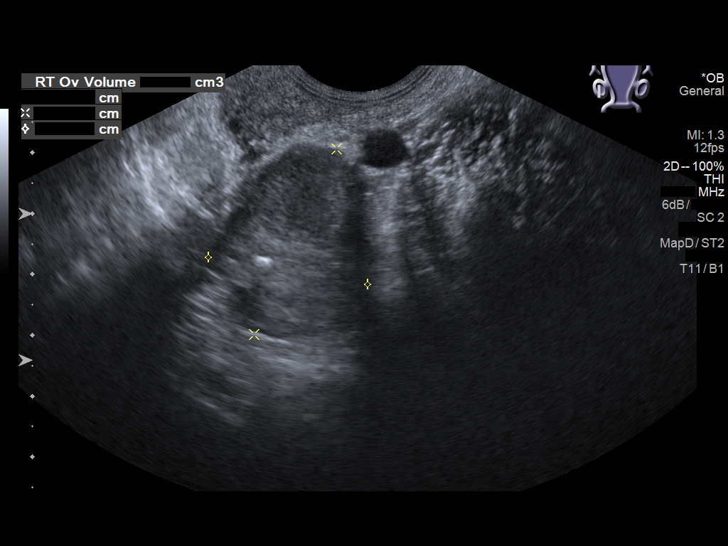
[im 60/74]
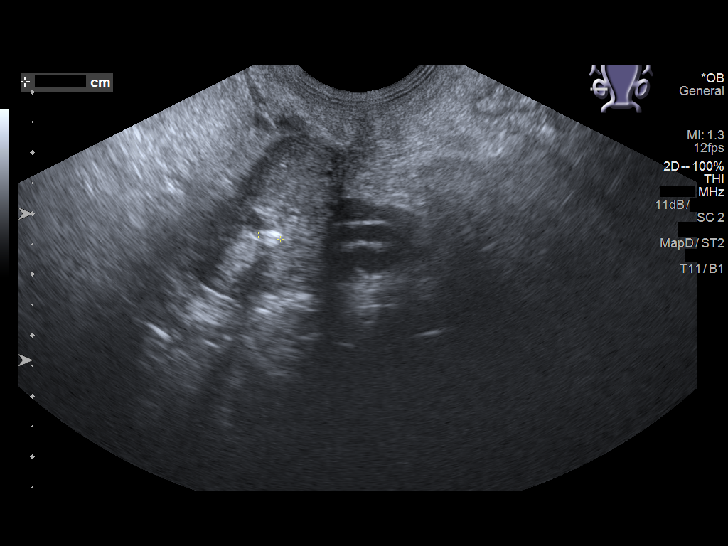
[im 65/74]
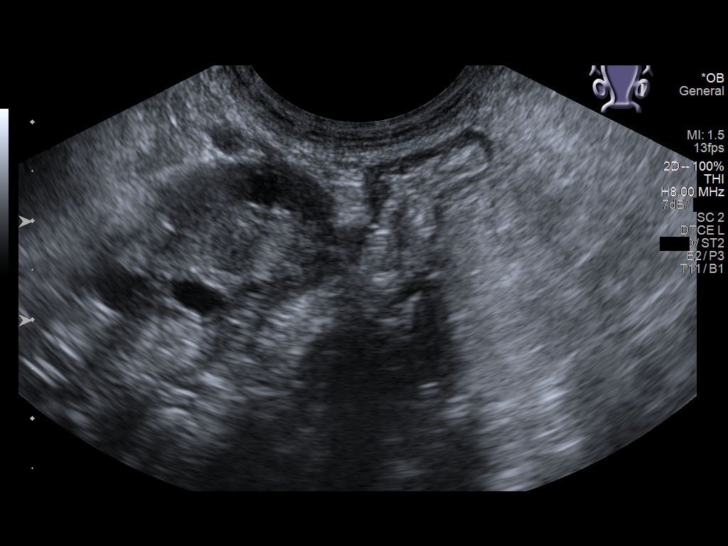
[im 71/74]
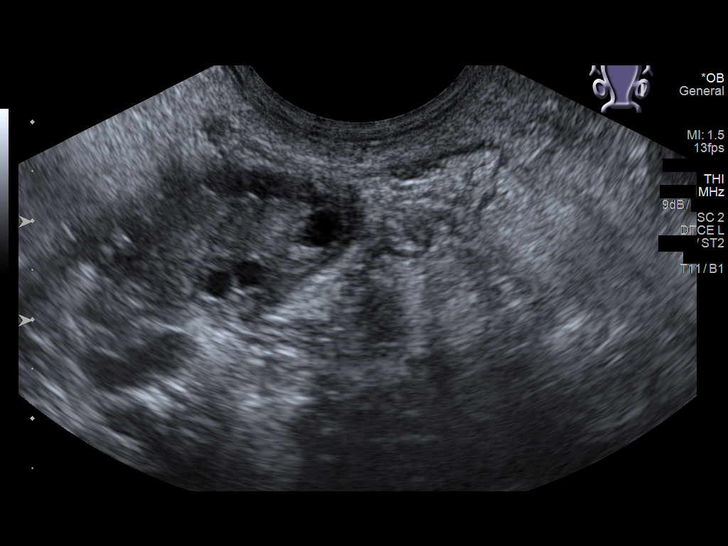

[13 of 28 positions shown; findings below may reference images not displayed]

FINDINGS: Intrauterine gestational sac: Single

Yolk sac:  Visualized.

Embryo:  Visualized.

Cardiac Activity: Visualized.

Heart Rate: 80 bpm

CRL:  7 mm   6 w   4 d                  US EDC: 10/01/2018

Subchorionic hemorrhage:  None visualized.

Maternal uterus/adnexae: Adjacent to the gestational sac is a
subcentimeter cystic area, which is adjacent to a uterine fibroid.
Heterogeneous myometrial echotexture. Small amount of fluid in the
cervical canal. The right ovary measures 2.1 x 3.3 x 2.7 cm and
contains a corpus luteal cyst. Shadowing 4 mm calcification is also
seen in the right ovary. Left ovary measures 2.6 x 1.7 x 2.3 cm and
is normal. No pelvic free fluid.
IMPRESSION: 1. Single live intrauterine pregnancy estimated gestational age 6
weeks 4 days based on crown-rump length for ultrasound EDC
10/01/2018.
2. Low fetal heart rate of 80 beats per minute. Recommend short
interval follow-up ultrasound to ensure appropriate fetal growth.
3. Uterine fibroid as well as heterogeneous myometrial echotexture.
Cystic structure adjacent to a uterine fibroid and the gestational
sac is of unknown significance. Recommend attention to this at
follow-up.

## 2020-09-27 ENCOUNTER — Ambulatory Visit
Admission: EM | Admit: 2020-09-27 | Discharge: 2020-09-27 | Disposition: A | Discharge status: Home / Self Care | Payer: 59 | Attending: Emergency Medicine | Admitting: Emergency Medicine

## 2020-09-27 ENCOUNTER — Other Ambulatory Visit: Payer: Self-pay

## 2020-09-27 ENCOUNTER — Encounter: Payer: Self-pay | Admitting: Emergency Medicine

## 2020-09-27 DIAGNOSIS — B084 Enteroviral vesicular stomatitis with exanthem: Secondary | ICD-10-CM

## 2020-09-27 MED ORDER — IBUPROFEN 600 MG PO TABS: 600.0000 mg | ORAL_TABLET | Freq: Four times a day (QID) | ORAL | 0 refills | Status: DC | PRN

## 2020-09-27 NOTE — Discharge Instructions (Addendum)
May take 600 mg of ibuprofen combined with 1000 mg of Tylenol together 3-4 times a day as needed for pain.  I would take the next several days off for work, although you could be contagious for up to several weeks.  Wash your hands frequently.  Here is a list of primary care providers who are taking new patients:  Dr. Otilio Miu [id number] [place] [address], [place] [address] [phone]  [facility] [place] [address]  [phone]  [facility] [address] [place], [place] [address] [phone]  [facility]  [phone] [place], [place] [address]  Here are clinics/ other resources who will see you if you do not have insurance. Some have certain criteria that you must meet. Call them and find out what they are:  [facility]: [address] [place], [place] [address] Phone: [phone] Hours: First and Third Saturdays of each Month, 9 a.m. - 1 p.m.  [facility]: [address] [place], [place], [place] [address] Phone: [phone] Hours: Tuesday, 4 p.m. - 8 p.m. Thursday, 1 p.m. - 8 p.m. Wednesday, 9 a.m. - [facility] [address] [place], [place] [address] Phone: [phone] Pharmacy Phone Number: [phone] Dental Phone Number: [phone] [facility]: [phone]  Dental Hours: Monday - Thursday, 8 a.m. - 6 p.m.  [facility] [address] [place], [place] [address] Phone: [phone] Pharmacy Phone Number: [phone] [facility] Insurance Help: [phone]  [facility] [address] [place], [place] [address] Phone: [phone] Pharmacy Phone Number: [phone] [facility] Insurance Help: [phone]  [facility] [address] [place], [place] [address] Phone: [phone] [facility] Insurance Help:  [phone]   [address] [place], [place] [address] Phone: [phone]  Go to www.goodrx.com  or www.costplusdrugs.com to look up your medications. This will give you a list of where you can find your prescriptions at the most affordable prices. Or ask the pharmacist what the cash price is, or if they have any other discount programs available to help make your medication more affordable. This can be less expensive than what you would pay with insurance.

## 2020-09-27 NOTE — ED Provider Notes (Signed)
HPI  SUBJECTIVE:  Lindsey Mills is a 45 y.o. female who presents with a painful, erythematous rash on her hands and feet starting yesterday.  States her child was diagnosed with hand-foot-and-mouth on 7/26.  No fevers, bodies, mouth sores, nasal congestion, diarrhea, headache, chest pain.  She has not tried anything for symptoms.  No aggravating or alleviating factors.  She has no past medical history.  LMP: 7/6.  Denies possibility being pregnant.  PMD: None.  Past Medical History:  Diagnosis Date   GERD (gastroesophageal reflux disease)     Past Surgical History:  Procedure Laterality Date   CERVICAL CERCLAGE     CERVICAL CERCLAGE N/A 12/04/2018   Procedure: CERCLAGE CERVICAL;  Surgeon: Ward, Honor Loh, MD;  Location: [facility];  Service: Gynecology;  Laterality: N/A;   CERVICAL CERCLAGE  05/10/2019   Procedure: CERCLAGE CERVICAL REMOVAL;  Surgeon: Ward, Honor Loh, MD;  Location: [facility];  Service: Obstetrics;;   CESAREAN SECTION     CESAREAN SECTION N/A 05/10/2019   Procedure: CESAREAN SECTION;  Surgeon: Maceo Pro, MD;  Location: [facility];  Service: Obstetrics;  Laterality: N/A;   DILATION AND CURETTAGE OF UTERUS     MYOMECTOMY      Family History  Problem Relation Age of Onset   Colon cancer Father    Breast cancer Neg Hx    Ovarian cancer Neg Hx    Diabetes Neg Hx    Heart disease Neg Hx     Social History   Tobacco Use   Smoking status: Never   Smokeless tobacco: Never  Vaping Use   Vaping Use: Never used  Substance Use Topics   Alcohol use: No   Drug use: No    No current facility-administered medications for this encounter.  Current Outpatient Medications:    ibuprofen (ADVIL) 600 MG tablet, Take 1 tablet (600 mg total) by mouth every 6 (six) hours as needed., Disp: 30 tablet, Rfl: 0   Prenatal Vit-Fe Fumarate-FA (MULTIVITAMIN-PRENATAL) 27-0.8 MG TABS tablet, Take 1 tablet by mouth every evening. , Disp: , Rfl:   No Known Allergies   ROS  As  noted in HPI.   Physical Exam  BP 134/78 (BP Location: Left Arm)   Pulse 72   Temp 98.2 F (36.8 C) (Oral)   Resp 18   Ht '4\' 11"'$  (1.499 m)   Wt 82.6 kg   LMP 08/30/2020 (Approximate)   SpO2 100%   Breastfeeding No   BMI 36.78 kg/m   Constitutional: Well developed, well nourished, no acute distress Eyes:  EOMI, conjunctiva normal bilaterally HENT: Normocephalic, atraumatic,mucus membranes moist Respiratory: Normal inspiratory effort Cardiovascular: Normal rate GI: nondistended skin: No intraoral ulcers.  Positive tender flat erythematous rash on palms of hands, soles of feet, and on fingers.             Neck: No cervical lymphadenopathy Musculoskeletal: no deformities Neurologic: Alert & oriented x 3, no focal neuro deficits Psychiatric: Speech and behavior appropriate   ED Course   Medications - No data to display  No orders of the defined types were placed in this encounter.   No results found for this or any previous visit (from the past 24 hour(s)). No results found.  ED Clinical Impression  1. Hand, foot and mouth disease      ED Assessment/Plan  Presentation consistent with hand-foot-and-mouth disease given that child recently had it.  doubt RMSF for syphilis.  Will send home with Tylenol/ibuprofen, will provide primary care list  and order assistance of finding a PMD.  Work note for 2 days. Pt is not working over the weekend.  Discussed MDM, treatment plan, and plan for follow-up with patient.  patient agrees with plan.   Meds ordered this encounter  Medications   ibuprofen (ADVIL) 600 MG tablet    Sig: Take 1 tablet (600 mg total) by mouth every 6 (six) hours as needed.    Dispense:  30 tablet    Refill:  0      *This clinic note was created using [employer]. Therefore, there may be occasional mistakes despite careful proofreading.  ?    Melynda Ripple, MD 09/27/20 [phone]

## 2020-09-27 NOTE — ED Triage Notes (Signed)
Pt c/o red spots on bilateral hands, and feet. She states her child recently had hand foot and mouth. Denies lesions in her mouth. She states the lesions are painful.

## 2021-04-11 ENCOUNTER — Other Ambulatory Visit: Payer: Self-pay

## 2021-04-11 ENCOUNTER — Ambulatory Visit
Admission: RE | Admit: 2021-04-11 | Discharge: 2021-04-11 | Disposition: A | Discharge status: Home / Self Care | Payer: 59 | Source: Ambulatory Visit | Attending: Emergency Medicine | Admitting: Emergency Medicine

## 2021-04-11 ENCOUNTER — Ambulatory Visit (INDEPENDENT_AMBULATORY_CARE_PROVIDER_SITE_OTHER): Payer: 59

## 2021-04-11 VITALS — BP 114/87 | HR 74 | Temp 98.7°F | Resp 18 | Ht 59.0 in | Wt 182.1 lb

## 2021-04-11 DIAGNOSIS — R110 Nausea: Secondary | ICD-10-CM | POA: Diagnosis not present

## 2021-04-11 DIAGNOSIS — R0789 Other chest pain: Secondary | ICD-10-CM | POA: Diagnosis not present

## 2021-04-11 DIAGNOSIS — J069 Acute upper respiratory infection, unspecified: Secondary | ICD-10-CM

## 2021-04-11 MED ORDER — AMOXICILLIN-POT CLAVULANATE 875-125 MG PO TABS: 1.0000 | ORAL_TABLET | Freq: Two times a day (BID) | ORAL | 0 refills | Status: AC

## 2021-04-11 MED ORDER — IPRATROPIUM BROMIDE 0.06 % NA SOLN: 2.0000 | Freq: Four times a day (QID) | NASAL | 12 refills | Status: DC

## 2021-04-11 MED ORDER — ONDANSETRON 8 MG PO TBDP: 8.0000 mg | ORAL_TABLET | Freq: Three times a day (TID) | ORAL | 0 refills | Status: DC | PRN

## 2021-04-11 NOTE — Discharge Instructions (Addendum)
Your chest x-ray did not show any evidence of pneumonia.  Your nausea may be coming from your protracted upper respiratory infection as when your body fights infection the white blood cells are collected to the lymphatic system and dumped into the gut which can cause nausea.  Because she had 2 weeks of upper respiratory symptoms Riggio trial of Augmentin twice daily with food for 10 days to treat the upper respiratory infection.  You may also use the Atrovent nasal spray, 2 squirts up each nostril every 6 hours, as needed for nasal congestion.  I have also prescribed you Zofran that you can use as needed for nausea.  It dissolves on or under the tongue and you can take it every 8 hours as needed for  Return for reevaluation, or see your PCP, for continued or worsening symptoms.

## 2021-04-11 NOTE — ED Provider Notes (Signed)
[facility]    CSN: [id number] Arrival date & time: 04/11/21  0816      History   Chief Complaint Chief Complaint  Patient presents with   Nausea    HPI Lindsey Mills is a 46 y.o. female.   HPI  46 year old female here for evaluation of nausea and sweating.  Patient reports that nausea and swelling started last night.  She had 1 episode of sweating that has not returned.  Nausea waxes and wanes but she also endorses a decreased appetite.  She denies any overt fever but states that her temperature was elevated at 99 8 last night.  She has been ill with a head cold for last 2 weeks with nasal congestion and clear nasal discharge.  She also reports that for last 2 days she has woken up with yellow crusty discharge on both lashes.  She denies abdominal pain, vomiting, diarrhea, or urinary symptoms.  She also denies any cough, ear pain, sore throat, shortness of breath, wheezing, headache, or body aches.  She was evaluated by her nurse at work who stated that she heard a rubbing when she listened to her lung sounds on the left posteriorly.  Past Medical History:  Diagnosis Date   GERD (gastroesophageal reflux disease)     Patient Active Problem List   Diagnosis Date Noted   S/P cesarean section 05/12/2019   Irregular periods/menstrual cycles 05/06/2017   Oligomenorrhea 05/06/2017   Dysmenorrhea 04/25/2016   Menorrhagia with regular cycle 04/25/2016   Antepartum multigravida of advanced maternal age 51/09/2016   History of cervical cerclage, currently pregnant 04/04/2016   History of cesarean section 04/04/2016   History of myomectomy 04/04/2016   Uterine leiomyoma 04/04/2016   Pelvic pain 04/04/2016   Obesity (BMI 35.0-39.9 without comorbidity) 04/04/2016    Past Surgical History:  Procedure Laterality Date   CERVICAL CERCLAGE     CERVICAL CERCLAGE N/A 12/04/2018   Procedure: CERCLAGE CERVICAL;  Surgeon: Maceo Pro, MD;  Location: [facility];   Service: Gynecology;  Laterality: N/A;   CERVICAL CERCLAGE  05/10/2019   Procedure: CERCLAGE CERVICAL REMOVAL;  Surgeon: Ward, Honor Loh, MD;  Location: [facility];  Service: Obstetrics;;   CESAREAN SECTION     CESAREAN SECTION N/A 05/10/2019   Procedure: CESAREAN SECTION;  Surgeon: Maceo Pro, MD;  Location: [facility];  Service: Obstetrics;  Laterality: N/A;   DILATION AND CURETTAGE OF UTERUS     MYOMECTOMY      OB History     Gravida  4   Para  2   Term  2   Preterm      AB  2   Living  2      SAB  1   IAB      Ectopic      Multiple  0   Live Births  2            Home Medications    Prior to Admission medications   Medication Sig Start Date End Date Taking? Authorizing Provider  amoxicillin-clavulanate (AUGMENTIN) 875-125 MG tablet Take 1 tablet by mouth every 12 (twelve) hours for 10 days. 04/11/21 04/21/21 Yes Margarette Canada, NP  ipratropium (ATROVENT) 0.06 % nasal spray Place 2 sprays into both nostrils 4 (four) times daily. 04/11/21  Yes Margarette Canada, NP  ondansetron (ZOFRAN-ODT) 8 MG disintegrating tablet Take 1 tablet (8 mg total) by mouth every 8 (eight) hours as needed for nausea or vomiting. 04/11/21  Yes Margarette Canada, NP  Family History Family History  Problem Relation Age of Onset   Colon cancer Father    Breast cancer Neg Hx    Ovarian cancer Neg Hx    Diabetes Neg Hx    Heart disease Neg Hx     Social History Social History   Tobacco Use   Smoking status: Never   Smokeless tobacco: Never  Vaping Use   Vaping Use: Never used  Substance Use Topics   Alcohol use: No   Drug use: No     Allergies   Patient has no known allergies.   Review of Systems Review of Systems  Constitutional:  Positive for appetite change and diaphoresis. Negative for fever.  HENT:  Positive for congestion, rhinorrhea and sinus pressure. Negative for ear pain and sore throat.   Eyes:  Positive for discharge. Negative for photophobia, pain, redness,  itching and visual disturbance.  Respiratory:  Negative for cough, shortness of breath and wheezing.   Gastrointestinal:  Positive for nausea. Negative for abdominal pain, diarrhea and vomiting.  Genitourinary:  Negative for dysuria, frequency and urgency.  Musculoskeletal:  Negative for arthralgias and myalgias.  Skin:  Negative for rash.  Neurological:  Negative for headaches.  Hematological: Negative.   Psychiatric/Behavioral: Negative.      Physical Exam Triage Vital Signs ED Triage Vitals  Enc Vitals Group     BP 04/11/21 0840 114/87     Pulse Rate 04/11/21 0840 74     Resp 04/11/21 0840 18     Temp 04/11/21 0840 98.7 F (37.1 C)     Temp Source 04/11/21 0840 Oral     SpO2 04/11/21 0840 100 %     Weight 04/11/21 0837 182 lb 1.6 oz (82.6 kg)     Height 04/11/21 0837 4\' 11"  (1.499 m)     Head Circumference --      Peak Flow --      Pain Score 04/11/21 0837 0     Pain Loc --      Pain Edu? --      Excl. in [place]? --    No data found.  Updated Vital Signs BP 114/87 (BP Location: Left Arm)    Pulse 74    Temp 98.7 F (37.1 C) (Oral)    Resp 18    Ht 4\' 11"  (1.499 m)    Wt 182 lb 1.6 oz (82.6 kg)    LMP 04/04/2021 (Approximate)    SpO2 100%    Breastfeeding No    BMI 36.78 kg/m   Visual Acuity Right Eye Distance:   Left Eye Distance:   Bilateral Distance:    Right Eye Near:   Left Eye Near:    Bilateral Near:     Physical Exam Vitals and nursing note reviewed.  Constitutional:      Appearance: Normal appearance. She is not ill-appearing.  HENT:     Head: Normocephalic and atraumatic.     Right Ear: Tympanic membrane, ear canal and external ear normal. There is no impacted cerumen.     Left Ear: Tympanic membrane, ear canal and external ear normal. There is no impacted cerumen.     Nose: Congestion and rhinorrhea present.     Mouth/Throat:     Mouth: Mucous membranes are moist.     Pharynx: Oropharynx is clear. No oropharyngeal exudate or posterior oropharyngeal  erythema.  Cardiovascular:     Rate and Rhythm: Normal rate and regular rhythm.     Pulses: Normal pulses.  Heart sounds: Normal heart sounds. No murmur heard.   No friction rub. No gallop.  Pulmonary:     Effort: Pulmonary effort is normal.     Breath sounds: Normal breath sounds. No wheezing, rhonchi or rales.  Musculoskeletal:     Cervical back: Normal range of motion and neck supple. No tenderness.  Lymphadenopathy:     Cervical: No cervical adenopathy.  Skin:    General: Skin is warm and dry.     Capillary Refill: Capillary refill takes less than 2 seconds.     Findings: No erythema or rash.  Neurological:     General: No focal deficit present.     Mental Status: She is alert and oriented to person, place, and time.  Psychiatric:        Mood and Affect: Mood normal.        Behavior: Behavior normal.        Thought Content: Thought content normal.        Judgment: Judgment normal.     UC Treatments / Results  Labs (all labs ordered are listed, but only abnormal results are displayed) Labs Reviewed - No data to display  EKG   Radiology DG Chest 2 View  Result Date: 04/11/2021 CLINICAL DATA:  Chest tightness.  Nausea and diaphoresis. EXAM: CHEST - 2 VIEW COMPARISON:  None. FINDINGS: The cardiomediastinal silhouette is within normal limits. The lungs are well inflated and clear. There is no evidence of pleural effusion or pneumothorax. No acute osseous abnormality is identified. IMPRESSION: No active cardiopulmonary disease. Electronically Signed   By: Logan Bores M.D.   On: 04/11/2021 09:42    Procedures Procedures (including critical care time)  Medications Ordered in UC Medications - No data to display  Initial Impression / Assessment and Plan / UC Course  I have reviewed the triage vital signs and the nursing notes.  Pertinent labs & imaging results that were available during my care of the patient were reviewed by me and considered in my medical decision  making (see chart for details).  Patient is a very pleasant, nontoxic-appearing 46 year old female here for evaluation of sweats and nausea then resurged with a decreased appetite started last night as outlined in HPI above.  She will had 1 episode of sweating that came on suddenly and then resolved.  Her nausea has waxed and waned since last night.  She did have elevated temp as outlined HPI above with a temp of 99.8 last night.  She is afebrile in clinic today.  She also endorses upper respiratory symptoms that been going on for the past 2 weeks.  She is concerned because the nurse at work evaluated her and stated that she heard rubbing when she listened to her lung fields on the left-hand side posteriorly.  On physical exam patient has pearly-gray tympanic membranes bilaterally with normal light reflex and clear external auditory canals.  Nasal mucosa is erythematous and edematous with clear discharge in both nares.  There is no tenderness to percussion of maxillary or frontal sinuses bilaterally.  Oropharyngeal exam is benign.  No cervical lymphadenopathy appreciable exam.  Cardiopulmonary exam feels clung sounds in all fields.  Patient is concerned because of the rubbing that the nurse or occurred so I will do a chest x-ray to rule out presence of pneumonia despite patient being afebrile with a normal respiratory rate and oxygen saturation of 100%.  This is more for peace of mind.  Patient also inquiring as to whether not it could  be a GI illness and I advised her that without vomiting or diarrhea it is unclear.  The nausea can be coming from several different sources.  She does have an obvious upper respiratory infection.  If the chest x-ray does not show any signs of pneumonia I will treat the patient for an upper respiratory infection, and given that her symptoms have been present for last 2 weeks a trial of antibiotics is warranted.   Chest x-ray independently reviewed and evaluated by me.  Impression:  No evidence of effusion or infiltrate on chest x-ray. Radiology overread is pending. Radiology impression is cardiomediastinal silhouette is within normal limits.  The lungs are well-inflated and clear.  No evidence of pleural effusion or pneumothorax.  No acute osseous abnormalities identified.  No active cardiopulmonary disease.  We will discharge patient home with a diagnosis of URI and place her on Augmentin twice daily for 10 days for treatment of her URI given that her symptoms are lasting longer than 10 days.  We will also give Atrovent nasal spray to help with the nasal congestion.  I will also prescribe Zofran as needed for nausea.  Work note provided.    Final Clinical Impressions(s) / UC Diagnoses   Final diagnoses:  Acute upper respiratory infection  Nausea     Discharge Instructions      Your chest x-ray did not show any evidence of pneumonia.  Your nausea may be coming from your protracted upper respiratory infection as when your body fights infection the white blood cells are collected to the lymphatic system and dumped into the gut which can cause nausea.  Because she had 2 weeks of upper respiratory symptoms Riggio trial of Augmentin twice daily with food for 10 days to treat the upper respiratory infection.  You may also use the Atrovent nasal spray, 2 squirts up each nostril every 6 hours, as needed for nasal congestion.  I have also prescribed you Zofran that you can use as needed for nausea.  It dissolves on or under the tongue and you can take it every 8 hours as needed for  Return for reevaluation, or see your PCP, for continued or worsening symptoms.     ED Prescriptions     Medication Sig Dispense Auth. Provider   amoxicillin-clavulanate (AUGMENTIN) 875-125 MG tablet Take 1 tablet by mouth every 12 (twelve) hours for 10 days. 20 tablet Margarette Canada, NP   ipratropium (ATROVENT) 0.06 % nasal spray Place 2 sprays into both nostrils 4 (four) times daily. 15  mL Margarette Canada, NP   ondansetron (ZOFRAN-ODT) 8 MG disintegrating tablet Take 1 tablet (8 mg total) by mouth every 8 (eight) hours as needed for nausea or vomiting. 20 tablet Margarette Canada, NP      PDMP not reviewed this encounter.   Margarette Canada, NP 04/11/21 [phone]

## 2021-04-11 NOTE — ED Triage Notes (Signed)
Pt c/o nausea, sweats. Started yesterday. Denies vomiting, diarrhea, or abdominal pain. Denies URI symptoms.

## 2021-09-30 ENCOUNTER — Ambulatory Visit
Admission: EM | Admit: 2021-09-30 | Discharge: 2021-09-30 | Disposition: A | Discharge status: Home / Self Care | Payer: 59 | Attending: Urgent Care | Admitting: Urgent Care

## 2021-09-30 ENCOUNTER — Encounter: Payer: Self-pay | Admitting: Emergency Medicine

## 2021-09-30 DIAGNOSIS — B309 Viral conjunctivitis, unspecified: Secondary | ICD-10-CM | POA: Diagnosis not present

## 2021-09-30 MED ORDER — TOBRAMYCIN 0.3 % OP SOLN: 1.0000 [drp] | OPHTHALMIC | 0 refills | Status: AC

## 2021-09-30 NOTE — ED Triage Notes (Signed)
Patient bilateral eye drainage and was matted closed this morning. Patient reports some runny nose. Patient denies fevers.

## 2021-09-30 NOTE — ED Provider Notes (Signed)
[facility]    CSN: [id number] Arrival date & time: 09/30/21  1246      History   Chief Complaint Chief Complaint  Patient presents with   Eye Problem    HPI Lindsey Mills is a 46 y.o. female.   46 year old female presents today with concerns of a bilateral eye problem.  She states yesterday she had a tiny bit of "grit" in her eyes, but it resolved.  Upon awakening this morning however, she reports a foreign body sensation like sand in her eye to both eyes, but much worse on the left.  She is a contact lens wear.  She recently did sleep with her contacts in.  She denies eye pain, photophobia, headache, mucopurulent discharge.  She denies pain with extraocular movements, swelling of the eyelid, or rash.  She denies any additional URI symptoms.  No recent COVID exposures.   Eye Problem Associated symptoms: redness     Past Medical History:  Diagnosis Date   GERD (gastroesophageal reflux disease)     Patient Active Problem List   Diagnosis Date Noted   Viral conjunctivitis of both eyes 09/30/2021   S/P cesarean section 05/12/2019   Irregular periods/menstrual cycles 05/06/2017   Oligomenorrhea 05/06/2017   Dysmenorrhea 04/25/2016   Menorrhagia with regular cycle 04/25/2016   Antepartum multigravida of advanced maternal age 84/09/2016   History of cervical cerclage, currently pregnant 04/04/2016   History of cesarean section 04/04/2016   History of myomectomy 04/04/2016   Uterine leiomyoma 04/04/2016   Pelvic pain 04/04/2016   Obesity (BMI 35.0-39.9 without comorbidity) 04/04/2016    Past Surgical History:  Procedure Laterality Date   CERVICAL CERCLAGE     CERVICAL CERCLAGE N/A 12/04/2018   Procedure: CERCLAGE CERVICAL;  Surgeon: Maceo Pro, MD;  Location: [facility];  Service: Gynecology;  Laterality: N/A;   CERVICAL CERCLAGE  05/10/2019   Procedure: CERCLAGE CERVICAL REMOVAL;  Surgeon: Ward, Honor Loh, MD;  Location: [facility];  Service:  Obstetrics;;   CESAREAN SECTION     CESAREAN SECTION N/A 05/10/2019   Procedure: CESAREAN SECTION;  Surgeon: Maceo Pro, MD;  Location: [facility];  Service: Obstetrics;  Laterality: N/A;   DILATION AND CURETTAGE OF UTERUS     MYOMECTOMY      OB History     Gravida  4   Para  2   Term  2   Preterm      AB  2   Living  2      SAB  1   IAB      Ectopic      Multiple  0   Live Births  2            Home Medications    Prior to Admission medications   Medication Sig Start Date End Date Taking? Authorizing Provider  tobramycin (TOBREX) 0.3 % ophthalmic solution Place 1 drop into both eyes every 4 (four) hours for 5 days. 09/30/21 10/05/21 Yes Adriane Gabbert L, PA    Family History Family History  Problem Relation Age of Onset   Colon cancer Father    Breast cancer Neg Hx    Ovarian cancer Neg Hx    Diabetes Neg Hx    Heart disease Neg Hx     Social History Social History   Tobacco Use   Smoking status: Never   Smokeless tobacco: Never  Vaping Use   Vaping Use: Never used  Substance Use Topics   Alcohol use: No  Drug use: No     Allergies   Patient has no known allergies.   Review of Systems Review of Systems  Eyes:  Positive for redness.  All other systems reviewed and are negative.    Physical Exam Triage Vital Signs ED Triage Vitals  Enc Vitals Group     BP 09/30/21 1312 120/76     Pulse Rate 09/30/21 1312 74     Resp 09/30/21 1312 14     Temp 09/30/21 1312 98.5 F (36.9 C)     Temp Source 09/30/21 1312 Oral     SpO2 09/30/21 1312 100 %     Weight 09/30/21 1311 180 lb (81.6 kg)     Height 09/30/21 1311 '4\' 11"'$  (1.499 m)     Head Circumference --      Peak Flow --      Pain Score 09/30/21 1311 0     Pain Loc --      Pain Edu? --      Excl. in [place]? --    No data found.  Updated Vital Signs BP 120/76 (BP Location: Left Arm)   Pulse 74   Temp 98.5 F (36.9 C) (Oral)   Resp 14   Ht '4\' 11"'$  (1.499 m)   Wt 180 lb (81.6  kg)   LMP 09/27/2021 (Exact Date)   SpO2 100%   BMI 36.36 kg/m   Visual Acuity Right Eye Distance: 20/70 uncorrected Left Eye Distance: 20/30 uncorrected Bilateral Distance: 20/30 uncorrected  Right Eye Near:   Left Eye Near:    Bilateral Near:     Physical Exam Vitals and nursing note reviewed. Exam conducted with a chaperone present.  Constitutional:      General: She is not in acute distress.    Appearance: Normal appearance. She is normal weight. She is not ill-appearing, toxic-appearing or diaphoretic.  HENT:     Head: Normocephalic and atraumatic.     Right Ear: External ear normal.     Left Ear: External ear normal.     Nose: Nose normal. No congestion or rhinorrhea.     Mouth/Throat:     Mouth: Mucous membranes are moist.  Eyes:     General: Lids are normal. Lids are everted, no foreign bodies appreciated. Vision grossly intact. Gaze aligned appropriately. No allergic shiner, visual field deficit or scleral icterus.       Right eye: No foreign body, discharge or hordeolum.        Left eye: No foreign body, discharge or hordeolum.     Extraocular Movements: Extraocular movements intact.     Right eye: Normal extraocular motion and no nystagmus.     Left eye: Normal extraocular motion and no nystagmus.     Conjunctiva/sclera:     Right eye: Right conjunctiva is injected. No chemosis, exudate or hemorrhage.    Left eye: Left conjunctiva is injected. No chemosis, exudate or hemorrhage.    Pupils: Pupils are equal, round, and reactive to light. Pupils are equal.     Right eye: Pupil is round, reactive and not sluggish. No corneal abrasion or fluorescein uptake.     Left eye: Pupil is round, reactive and not sluggish. No corneal abrasion or fluorescein uptake.     Visual Fields: Right eye visual fields normal and left eye visual fields normal.     Comments: VA abnormal as pt cannot see without contacts, has astigmatism on the R  Neurological:     Mental Status: She is  alert.  UC Treatments / Results  Labs (all labs ordered are listed, but only abnormal results are displayed) Labs Reviewed - No data to display  EKG   Radiology No results found.  Procedures Procedures (including critical care time)  Medications Ordered in UC Medications - No data to display  Initial Impression / Assessment and Plan / UC Course  I have reviewed the triage vital signs and the nursing notes.  Pertinent labs & imaging results that were available during my care of the patient were reviewed by me and considered in my medical decision making (see chart for details).     Conjunctivitis B eyes -patient was concerned about possible corneal ulceration as she has had this in the past.  Fluorescein stain does not show any uptake.  Her symptoms are bilateral with mild scleral injection and tearing.  This favors a viral conjunctivitis, most likely adenovirus given lack of additional URI symptoms.  Defer COVID testing.  Given recent inappropriate contact use however, will cover with tobramycin to ensure there is no bacterial component.  Additional supportive recommendations discussed.   Final Clinical Impressions(s) / UC Diagnoses   Final diagnoses:  Viral conjunctivitis of both eyes     Discharge Instructions      You have viral pinkeye, which is most commonly caused by Adenovirus. THIS IS HIGHLY CONTAGIOUS. Please avoid touching your eye; if you do, [place]! I have prescribed tobramycin eye drops to PREVENT secondary bacterial infection given your contact lenses. (Your insurance would not cover the ointment) Use this on both eyes three times daily x 5 days. Warm moist washcloths with Wynetta Emery and Johnson baby shampoo can be used to gently massage and cleans to eyes. Return to clinic if any fever, eye pain, change in vision.      ED Prescriptions     Medication Sig Dispense Auth. Provider   tobramycin (TOBREX) 0.3 % ophthalmic solution Place 1  drop into both eyes every 4 (four) hours for 5 days. 1.5 mL Innocence Schlotzhauer L, PA      PDMP not reviewed this encounter.   Chaney Malling, [place] 09/30/21 1348

## 2021-09-30 NOTE — Discharge Instructions (Addendum)
You have viral pinkeye, which is most commonly caused by Adenovirus. THIS IS HIGHLY CONTAGIOUS. Please avoid touching your eye; if you do, Lindsey Mills! I have prescribed tobramycin eye drops to PREVENT secondary bacterial infection given your contact lenses. (Your insurance would not cover the ointment) Use this on both eyes three times daily x 5 days. Warm moist washcloths with Wynetta Emery and Johnson baby shampoo can be used to gently massage and cleans to eyes. Return to clinic if any fever, eye pain, change in vision.

## 2021-11-29 DIAGNOSIS — E559 Vitamin D deficiency, unspecified: Secondary | ICD-10-CM | POA: Insufficient documentation

## 2022-04-20 IMAGING — CR DG CHEST 2V
2 series · 2 of 2 positions shown · non-contrast
Comparison: None.

CLINICAL DATA: Chest tightness.  Nausea and diaphoresis.

EXAM:
CHEST - 2 VIEW

[chest pa]
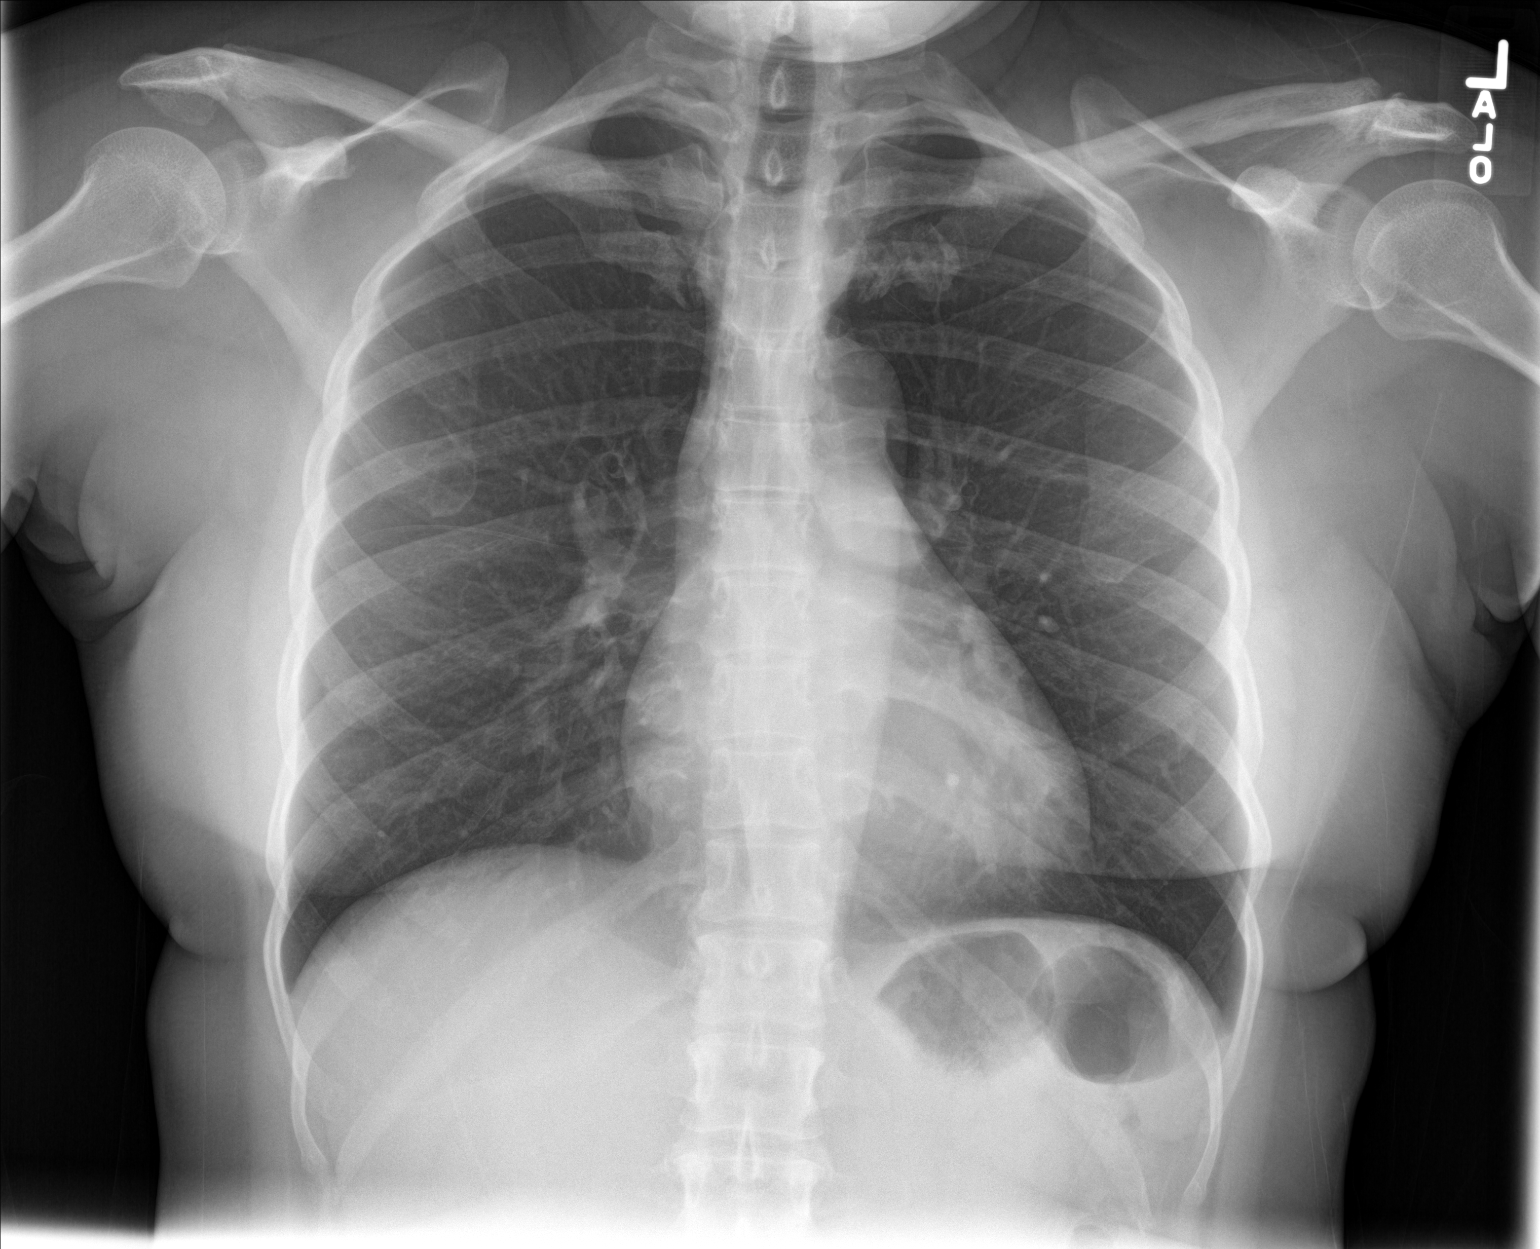

[chest lat]
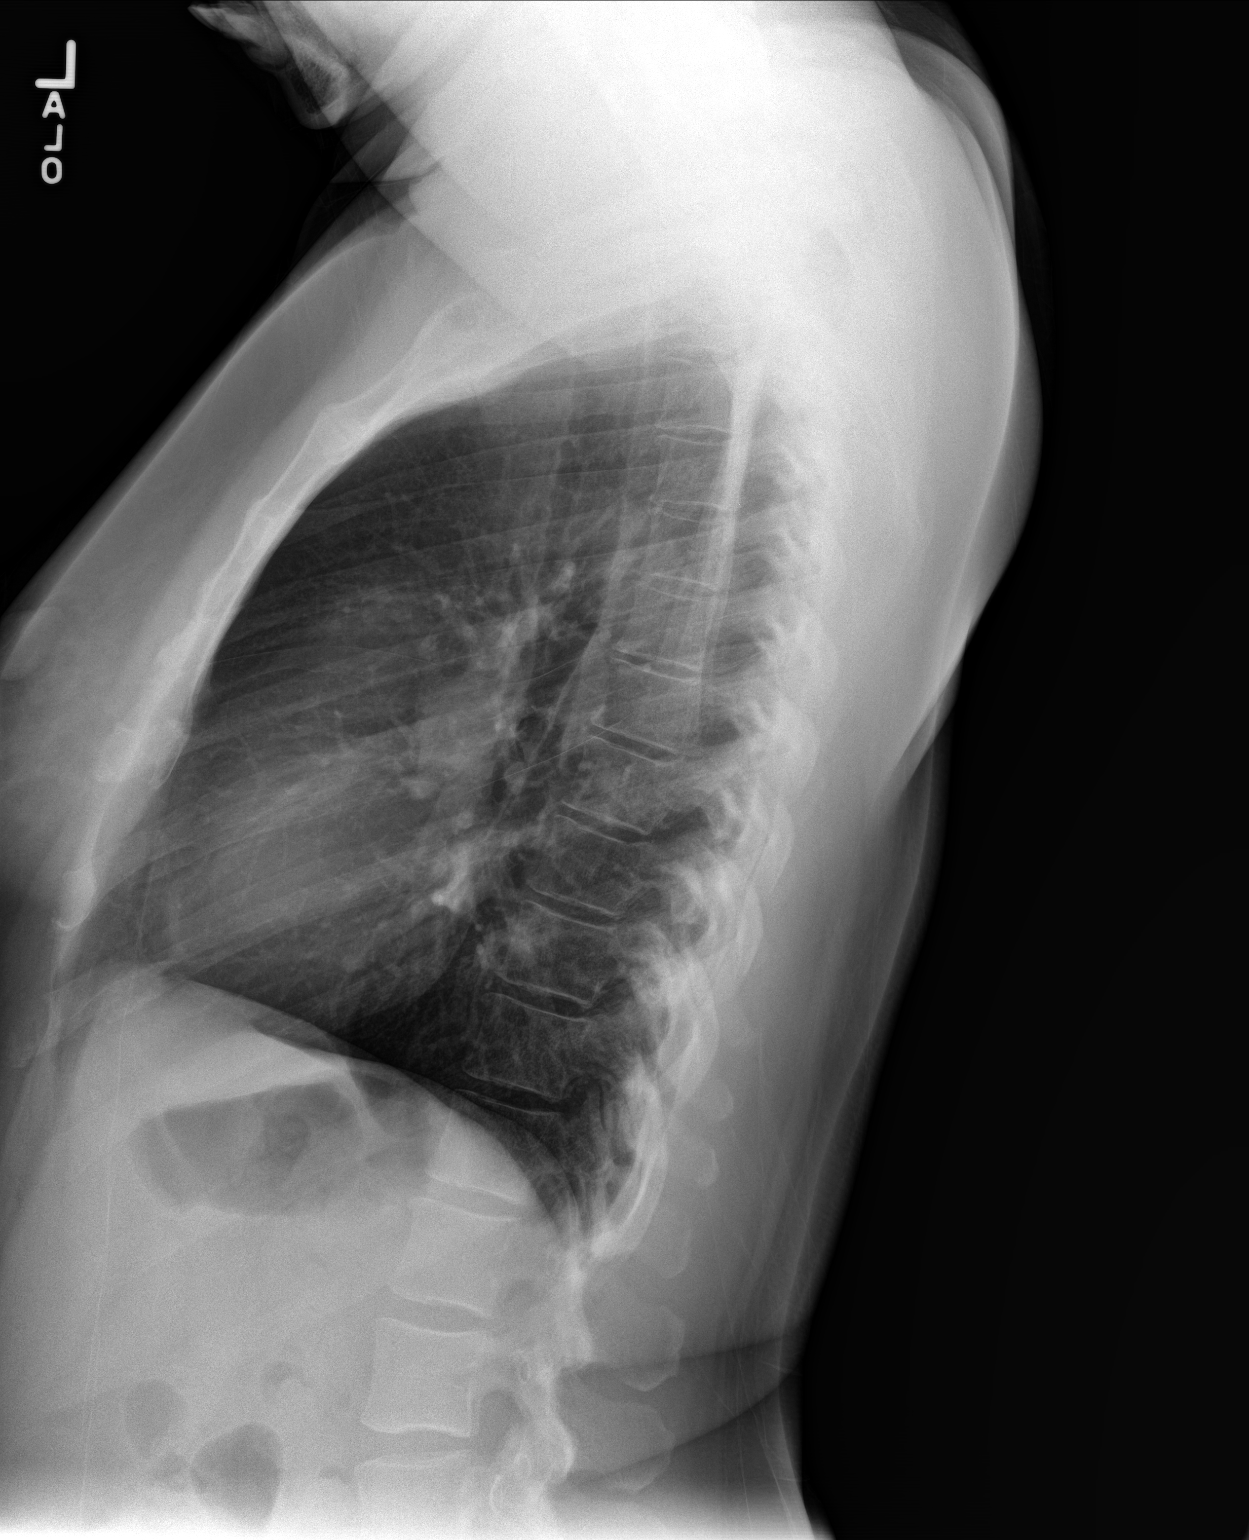

[2 of 2 positions shown; findings below may reference images not displayed]

FINDINGS: The cardiomediastinal silhouette is within normal limits. The lungs
are well inflated and clear. There is no evidence of pleural
effusion or pneumothorax. No acute osseous abnormality is
identified.
IMPRESSION: No active cardiopulmonary disease.

## 2022-12-16 ENCOUNTER — Telehealth: Payer: Self-pay

## 2022-12-16 NOTE — Telephone Encounter (Signed)
Patient left a voicemail on voicemail at 1:00pm stating this is her 4th time calling trying to make a appointment with our office. I return patient call to ask her when she called she states she called 2 times on Wednesday 12/11/2022 and then once on Thursday 12/12/2022. She states no one called her back. Return patient call and apologized for no one calling her back and informed patient we would need a referral referring her to our office and once we received that referral we can schedule her a appointment. She states she will get one sent to our office.

## 2022-12-16 NOTE — Telephone Encounter (Signed)
okay

## 2023-01-08 ENCOUNTER — Telehealth: Payer: Self-pay

## 2023-01-08 NOTE — Telephone Encounter (Signed)
Pt call to make appt for Colonoscopy. Per pt her obgyn was supposed to send referral. I ask pt to ask referring office to resend referral. Also to check to see if off sent the referral to [facility] since her obgyn is with [facility].

## 2023-03-15 ENCOUNTER — Other Ambulatory Visit: Payer: Self-pay | Admitting: Obstetrics

## 2023-03-15 DIAGNOSIS — D509 Iron deficiency anemia, unspecified: Secondary | ICD-10-CM

## 2023-03-15 NOTE — Progress Notes (Signed)
Patient's hgb 9.6, ferritin 3 on 03/13/23, I have ordered IV venofer infusions weekly x 4  Decklin Weddington CNM

## 2024-03-12 ENCOUNTER — Ambulatory Visit: Admitting: Family Medicine

## 2024-03-12 VITALS — BP 134/85 | HR 74 | Temp 98.2°F | Resp 16 | Wt 185.0 lb

## 2024-03-12 DIAGNOSIS — E782 Mixed hyperlipidemia: Secondary | ICD-10-CM

## 2024-03-12 DIAGNOSIS — Z1231 Encounter for screening mammogram for malignant neoplasm of breast: Secondary | ICD-10-CM

## 2024-03-12 DIAGNOSIS — R5383 Other fatigue: Secondary | ICD-10-CM

## 2024-03-12 DIAGNOSIS — Z1211 Encounter for screening for malignant neoplasm of colon: Secondary | ICD-10-CM | POA: Diagnosis not present

## 2024-03-12 DIAGNOSIS — E559 Vitamin D deficiency, unspecified: Secondary | ICD-10-CM | POA: Diagnosis not present

## 2024-03-12 DIAGNOSIS — D509 Iron deficiency anemia, unspecified: Secondary | ICD-10-CM

## 2024-03-13 ENCOUNTER — Telehealth: Payer: Self-pay | Admitting: Family Medicine

## 2024-03-13 DIAGNOSIS — Z1239 Encounter for other screening for malignant neoplasm of breast: Secondary | ICD-10-CM | POA: Insufficient documentation

## 2024-03-13 DIAGNOSIS — R5383 Other fatigue: Secondary | ICD-10-CM | POA: Insufficient documentation

## 2024-03-13 DIAGNOSIS — D509 Iron deficiency anemia, unspecified: Secondary | ICD-10-CM | POA: Insufficient documentation

## 2024-03-13 LAB — LIPID PANEL
Chol/HDL Ratio: 2.9 ratio (ref 0.0–4.4)
Cholesterol, Total: 191 mg/dL (ref 100–199)
HDL: 66 mg/dL
LDL Chol Calc (NIH): 115 mg/dL — ABNORMAL HIGH (ref 0–99)
Triglycerides: 50 mg/dL (ref 0–149)
VLDL Cholesterol Cal: 10 mg/dL (ref 5–40)

## 2024-03-13 LAB — COMPREHENSIVE METABOLIC PANEL WITH GFR
ALT: 11 IU/L (ref 0–32)
AST: 16 IU/L (ref 0–40)
Albumin: 4 g/dL (ref 3.9–4.9)
Alkaline Phosphatase: 75 IU/L (ref 41–116)
BUN/Creatinine Ratio: 10 (ref 9–23)
BUN: 7 mg/dL (ref 6–24)
Bilirubin Total: 0.2 mg/dL (ref 0.0–1.2)
CO2: 20 mmol/L (ref 20–29)
Calcium: 8.9 mg/dL (ref 8.7–10.2)
Chloride: 103 mmol/L (ref 96–106)
Creatinine, Ser: 0.67 mg/dL (ref 0.57–1.00)
Globulin, Total: 2.8 g/dL (ref 1.5–4.5)
Glucose: 84 mg/dL (ref 70–99)
Potassium: 4.6 mmol/L (ref 3.5–5.2)
Sodium: 139 mmol/L (ref 134–144)
Total Protein: 6.8 g/dL (ref 6.0–8.5)
eGFR: 108 mL/min/1.73

## 2024-03-13 LAB — IRON,TIBC AND FERRITIN PANEL
Ferritin: 8 ng/mL — ABNORMAL LOW (ref 15–150)
Iron Saturation: 8 % — CL (ref 15–55)
Iron: 29 ug/dL (ref 27–159)
Total Iron Binding Capacity: 376 ug/dL (ref 250–450)
UIBC: 347 ug/dL (ref 131–425)

## 2024-03-13 LAB — CBC WITH DIFFERENTIAL/PLATELET
Basophils Absolute: 0 x10E3/uL (ref 0.0–0.2)
Basos: 1 %
EOS (ABSOLUTE): 0.1 x10E3/uL (ref 0.0–0.4)
Eos: 2 %
Hematocrit: 34 % (ref 34.0–46.6)
Hemoglobin: 9.6 g/dL — ABNORMAL LOW (ref 11.1–15.9)
Immature Grans (Abs): 0 x10E3/uL (ref 0.0–0.1)
Immature Granulocytes: 0 %
Lymphocytes Absolute: 1.6 x10E3/uL (ref 0.7–3.1)
Lymphs: 37 %
MCH: 21.2 pg — ABNORMAL LOW (ref 26.6–33.0)
MCHC: 28.2 g/dL — ABNORMAL LOW (ref 31.5–35.7)
MCV: 75 fL — ABNORMAL LOW (ref 79–97)
Monocytes Absolute: 0.3 x10E3/uL (ref 0.1–0.9)
Monocytes: 6 %
Neutrophils Absolute: 2.4 x10E3/uL (ref 1.4–7.0)
Neutrophils: 54 %
Platelets: 441 x10E3/uL (ref 150–450)
RBC: 4.52 x10E6/uL (ref 3.77–5.28)
RDW: 17.9 % — ABNORMAL HIGH (ref 11.7–15.4)
WBC: 4.4 x10E3/uL (ref 3.4–10.8)

## 2024-03-13 LAB — VITAMIN D 25 HYDROXY (VIT D DEFICIENCY, FRACTURES): Vit D, 25-Hydroxy: 36.8 ng/mL (ref 30.0–100.0)

## 2024-03-13 LAB — VITAMIN B12: Vitamin B-12: 1101 pg/mL (ref 232–1245)

## 2024-03-13 LAB — TSH+FREE T4
Free T4: 1.06 ng/dL (ref 0.82–1.77)
TSH: 0.847 u[IU]/mL (ref 0.450–4.500)

## 2024-03-13 NOTE — Assessment & Plan Note (Addendum)
 She has not done Cologuard nor has she had a colonoscopy.  Reports no one in her family has ever had colon cancer.  She is of average risk and can do Cologuard.   She is overdue for her mammogram.  As she has never had one.  Will send her to [facility].

## 2024-03-13 NOTE — Progress Notes (Signed)
 "  New Patient Office Visit  Subjective    Patient ID: Lindsey Mills, female    DOB: Jan 20, 1976  Age: 49 y.o. MRN: [id number]  CC:  Chief Complaint  Patient presents with   Establish Care    Pt. Here to establish care. Pt. Denies pain at this time.    HPI [name] presents to establish care Discussed the use of AI scribe software for clinical note transcription with the patient, who gave verbal consent to proceed.  History of Present Illness   Lindsey Mills is a 49 year old female who presents with concerns about weight gain and fatigue.  She has been experiencing weight gain despite not overeating and feels more tired than usual. There is a noted decrease in her exercise routine. She has a history of iron deficiency and was previously advised to receive an iron infusion, which she declined. Currently, she is taking prenatal vitamins and vitamin D  once a week but not iron supplements.  She experiences constipation and increased gas. A coworker suggested that her gas might be due to constipation, and she took magnesium, which helped her have a bowel movement. She is concerned about the safety of taking magnesium regularly and is interested in more natural remedies. She acknowledges inadequate water intake, which may contribute to her constipation.  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 Her responsibilities at home and work contribute to her difficulty in maintaining a regular exercise routine. Her family recently experienced a bout of illness over the Christmas break, affecting her and her family members.  In terms of past medical history, she has had a fibroid removed in the past and wonders if it has returned due to experiencing heavy periods. She does not take regular medications and prefers natural remedies when possible.     She has not had screening  for colon cancer.  Reports no one in her family has had colon cancer.   She is due a mammogram and wants to go to [place].    Outpatient Encounter Medications as of 03/12/2024  Medication Sig   ascorbic acid (VITAMIN C) 500 MG tablet Take 500 mg by mouth.   Vitamin D , Ergocalciferol , (DRISDOL) 1.25 MG (50000 UNIT) CAPS capsule Take 50,000 Units by mouth once a week.   No facility-administered encounter medications on file as of 03/12/2024.    Past Medical History:  Diagnosis Date   GERD (gastroesophageal reflux disease)     Past Surgical History:  Procedure Laterality Date   CERVICAL CERCLAGE     CERVICAL CERCLAGE N/A 12/04/2018   Procedure: CERCLAGE CERVICAL;  Surgeon: Ward, Mitzie BROCKS, MD;  Location: [facility] OR[facility];  Service: Gynecology;  Laterality: N/A;   CERVICAL CERCLAGE  05/10/2019   Procedure: CERCLAGE CERVICAL REMOVAL;  Surgeon: Ward, Mitzie BROCKS, MD;  Location: [facility] OR[facility];  Service: Obstetrics;;   CESAREAN SECTION     CESAREAN SECTION N/A 05/10/2019   Procedure: CESAREAN SECTION;  Surgeon: Neomi Mitzie BROCKS, MD;  Location: [facility] ORS;  Service: Obstetrics;  Laterality: N/A;   DILATION AND CURETTAGE OF UTERUS     MYOMECTOMY      Family History  Problem Relation Age of Onset   Colon cancer Father    Breast cancer Neg Hx    Ovarian cancer Neg Hx    Diabetes Neg Hx    Heart disease Neg Hx     Social History  Socioeconomic History   Marital status: Married    Spouse name: Not on file   Number of children: Not on file   Years of education: Not on file   Highest education level: Not on file  Occupational History   Not on file  Tobacco Use   Smoking status: Never   Smokeless tobacco: Never  Vaping Use   Vaping status: Never Used  Substance and Sexual Activity   Alcohol use: No   Drug use: No   Sexual activity: Yes    Partners: Male    Birth control/protection: None  Other Topics Concern   Not on file  Social History Narrative   Not on file   Social Drivers of Health    Tobacco Use: Low Risk  (01/22/2023)   Received from [facility]   Patient History    Smoking Tobacco Use: Never    Smokeless Tobacco Use: Never    Passive Exposure: Not on file  Financial Resource Strain: Medium Risk (12/27/2022)   Received from [facility]   Overall Financial Resource Strain (CARDIA)    Difficulty of Paying Living Expenses: Somewhat hard  Food Insecurity: No Food Insecurity (12/27/2022)   Received from [facility]   Epic    Within the past 12 months, you worried that your food would run out before you got the money to buy more.: Never true    Within the past 12 months, the food you bought just didn't last and you didn't have money to get more.: Never true  Transportation Needs: No Transportation Needs (12/27/2022)   Received from [facility] - Transportation    In the past 12 months, has lack of transportation kept you from medical appointments or from getting medications?: No    Lack of Transportation (Non-Medical): No  Physical Activity: Not on file  Stress: Not on file  Social Connections: Not on file  Intimate Partner Violence: Not on file  Depression (PHQ2-9): Low Risk (03/12/2024)   Depression (PHQ2-9)    PHQ-2 Score: 3  Alcohol Screen: Not on file  Housing: Low Risk  (03/13/2023)   Received from [facility]   Epic    At any time in the past 12 months, were you homeless or living in a shelter (including now)?: No    In the past 12 months, how many times have you moved where you were living?: 0    In the last 12 months, was there a time when you were not able to pay the mortgage or rent on time?: No  Utilities: Not At Risk (12/27/2022)   Received from [facility] Utilities    Threatened with loss of utilities: No  Health Literacy: Not on file         Objective   BP 134/85 (Cuff Size: Normal)   Pulse 74   Temp 98.2 F (36.8 C) (Oral)   Resp 16    Wt 185 lb (83.9 kg)   LMP 02/12/2024 (Exact Date)   SpO2 98%   BMI 37.37 kg/m    Physical Exam Vitals and nursing note reviewed.  Constitutional:      Appearance: Normal appearance.  HENT:     Head: Normocephalic and atraumatic.  Eyes:     Conjunctiva/sclera: Conjunctivae normal.  Cardiovascular:     Rate and Rhythm: Normal rate and regular rhythm.  Pulmonary:     Effort: Pulmonary effort is normal.  Breath sounds: Normal breath sounds.  Musculoskeletal:     Right lower leg: No edema.     Left lower leg: No edema.  Skin:    General: Skin is warm and dry.  Neurological:     Mental Status: She is alert and oriented to person, place, and time.  Psychiatric:        Mood and Affect: Mood normal.        Behavior: Behavior normal.        Thought Content: Thought content normal.        Judgment: Judgment normal.            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  She has been asked to take iron infusions but has declined.  Is not taking an iron supplement.  Checking her CBC and iron indices.  Orders: -     Iron, TIBC and Ferritin Panel  Other fatigue Assessment & Plan: Reports that she is often quite tired even though she is getting adequate rest.  Will check CBC, CMP, thyroid function B12 and vitamin D .  Orders: -     CBC with Differential/Platelet -     Comprehensive metabolic panel with GFR -     TSH -     TSH + free T4 -     Vitamin B12  Vitamin D  deficiency -     VITAMIN D  25 Hydroxy (Vit-D Deficiency, Fractures)  Mixed hyperlipidemia -     Lipid panel  Screening for colon cancer Assessment & Plan: She has not done Cologuard nor has she had a colonoscopy.  Reports no one in her family has ever had colon cancer.  She is of average risk and can do Cologuard.   She is overdue for her  mammogram.  As she has never had one.  Will send her to [facility].  Orders: -     Cologuard  Encounter for screening mammogram for malignant neoplasm of breast Assessment & Plan: She has not done Cologuard nor has she had a colonoscopy.  Reports no one in her family has ever had colon cancer.  She is of average risk and can do Cologuard.   She is overdue for her mammogram.  As she has never had one.  Will send her to [facility].     Return in about 3 months (around 06/10/2024).   Brahim Dolman K Khrystina Bonnes, MD  "

## 2024-03-13 NOTE — Telephone Encounter (Signed)
 Advised that her iron saturation is 8 and her ferritin level is 8 with a hemoglobin of 9.6.  Advised she could get an iron infusion or take iron by mouth.  She does not want to get an iron infusion.  She agrees to take iron sulfate 325 mg twice a day with a vitamin C.  Asked her to follow-up in a month to check her CBC and iron indices.  She agrees

## 2024-03-13 NOTE — Assessment & Plan Note (Signed)
 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  She has been asked to take iron infusions but has declined.  Is not taking an iron supplement.  Checking her CBC and iron indices.

## 2024-03-13 NOTE — Assessment & Plan Note (Signed)
 Reports that she is often quite tired even though she is getting adequate rest.  Will check CBC, CMP, thyroid function B12 and vitamin D .
